# Patient Record
Sex: Male | Born: 1980 | Race: White | Hispanic: No | Marital: Married | Smoking: Current every day smoker
Health system: Southern US, Community
[De-identification: ages and names within clinical notes are randomized; demographics above are authoritative.]

## PROBLEM LIST (undated history)

## (undated) DIAGNOSIS — R569 Unspecified convulsions: Secondary | ICD-10-CM

## (undated) HISTORY — PX: TONSILLECTOMY: SUR1361

---

## 2015-04-07 ENCOUNTER — Encounter: Payer: Self-pay | Admitting: *Deleted

## 2015-04-07 ENCOUNTER — Emergency Department
Admission: EM | Admit: 2015-04-07 | Discharge: 2015-04-07 | Disposition: A | Discharge status: Home / Self Care | Payer: Self-pay | Attending: Emergency Medicine | Admitting: Emergency Medicine

## 2015-04-07 DIAGNOSIS — F17210 Nicotine dependence, cigarettes, uncomplicated: Secondary | ICD-10-CM | POA: Insufficient documentation

## 2015-04-07 DIAGNOSIS — B084 Enteroviral vesicular stomatitis with exanthem: Secondary | ICD-10-CM | POA: Insufficient documentation

## 2015-04-07 NOTE — ED Notes (Signed)
Pt reports having a rash on hands and feet beginning Friday. Rash appears to be little red papules that are scattered around pts finers and palms and onto his toes. Pt reports having a fever Friday and feeling weak and "aching"Pt denies NVD.

## 2015-04-07 NOTE — ED Provider Notes (Signed)
CSN: [id number]     Arrival date & time 04/07/15  1649 History   First MD Initiated Contact with Patient 04/07/15 1731     Chief Complaint  Patient presents with  . Rash     (Consider location/radiation/quality/duration/timing/severity/associated sxs/prior Treatment) HPI  34 year old male presents to emergency department for evaluation of rash on his hands and feet. Symptoms began 3 days ago, he developed a low-grade fever and body aches. Symptoms quickly resolved and over the last 2 days has had a increasing rash on the plantar aspect of his feet as well has palmar aspect of the hands and in between the digits. He denies any oral lesions. No pruritus. No headaches, nausea, vomiting, fevers. No known contacts with hand-foot-and-mouth disease. Discomfort is moderate and mostly on the plantar aspect of his feet with walking. He denies any oral lesions, conjunctivitis.   No past medical history on file. No past surgical history on file. No family history on file. Social History  Substance Use Topics  . Smoking status: Current Every Day Smoker -- 1.00 packs/day    Types: Cigarettes  . Smokeless tobacco: Not on file  . Alcohol Use: No    Review of Systems  Constitutional: Negative.  Negative for fever, chills, activity change and appetite change.  HENT: Negative for congestion, ear pain, mouth sores, rhinorrhea, sinus pressure, sore throat and trouble swallowing.   Eyes: Negative for photophobia, pain and discharge.  Respiratory: Negative for cough, chest tightness and shortness of breath.   Cardiovascular: Negative for chest pain and leg swelling.  Gastrointestinal: Negative for nausea, vomiting, abdominal pain, diarrhea and abdominal distention.  Genitourinary: Negative for dysuria and difficulty urinating.  Musculoskeletal: Negative for back pain, arthralgias and gait problem.  Skin: Positive for rash. Negative for color change.  Neurological: Negative for dizziness and headaches.   Hematological: Negative for adenopathy.  Psychiatric/Behavioral: Negative for behavioral problems and agitation.      Allergies  Review of patient's allergies indicates not on file.  Home Medications   Prior to Admission medications   Not on File   BP 125/62 mmHg  Pulse 51  Temp(Src) 97.9 F (36.6 C) (Oral)  Resp 20  Ht  (1.905 m)  Wt 240 lb (108.863 kg)  BMI 30.00 kg/m2  SpO2 95% Physical Exam  Constitutional: He is oriented to person, place, and time. He appears well-developed and well-nourished.  HENT:  Head: Normocephalic and atraumatic.  Mouth/Throat: Oropharynx is clear and moist. No oropharyngeal exudate.  Eyes: Conjunctivae and EOM are normal. Pupils are equal, round, and reactive to light. Right eye exhibits no discharge. Left eye exhibits no discharge.  Neck: Normal range of motion. Neck supple.  Cardiovascular: Normal rate and intact distal pulses.   Pulmonary/Chest: Effort normal and breath sounds normal. No respiratory distress.  Abdominal: Soft. Bowel sounds are normal. There is no tenderness.  Musculoskeletal: Normal range of motion. He exhibits no edema or tenderness.  Neurological: He is alert and oriented to person, place, and time.  Skin: Skin is warm and dry. Rash noted.  Papular rash on the plantar aspect of both feet with small vesicles. Rash is also interdigital. Patient also has papular rash on the palmar aspect of both hands and in between the digits. Signs of infection. No oral lesions.  Psychiatric: He has a normal mood and affect. His behavior is normal. Judgment and thought content normal.    ED Course  Procedures (including critical care time) Labs Review Labs Reviewed - No data to  display  Imaging Review No results found. I have personally reviewed and evaluated these images and lab results as part of my medical decision-making.   EKG Interpretation None      MDM   Final diagnoses:  Hand, foot and mouth disease    34 year old male with hand-foot-and-mouth disease. He has a papular rash that is nonpruritic on the plantar and palmar aspects as well as interdigital areas of the hands and feet. No other symptoms. Signs stable. Patient was educated on progression of hand-foot-and-mouth disease as well as treatment and prevention of spreading disease. Patient will return to the ER for any worsening symptoms urgent changes in his health.   Evon Slackhomas C Rodel Glaspy, PA-C 04/07/15 1756  Jeanmarie PlantJames A McShane, MD 04/07/15 [phone]

## 2015-04-07 NOTE — Discharge Instructions (Signed)
What is hand, foot, and mouth disease? -- Hand, foot, and mouth disease is an infection that causes sores to form in the mouth, and on the hands, feet, buttocks, and sometimes the genitals. A related infection, called "herpangina," causes sores to form in the mouth. Both infections most often affect children, but adults can get them, too. This article is about hand, foot, and mouth disease, but herpangina and hand, foot, and mouth disease are treated the same. Hand, foot, and mouth disease usually goes away on its own within 2 to 3 days. There are treatments to help with its symptoms. What are the symptoms of hand, foot, and mouth disease? -- The main symptom is sores that form in the mouth, and on the hands, feet, buttocks, and sometimes the genitals. They can look like small red spots, bumps, or blisters (picture 1 and picture 2). The sores in the mouth can make swallowing painful. The sores on the hands and feet might be painful. It is possible to get the sores only in some areas. Not every person gets them on their hands, feet, and mouth. The infection sometimes causes fever. How does hand, foot, and mouth disease spread? -- The virus that causes hand, foot, and mouth disease can travel in body fluids of an infected person. For example, the virus can be found in: ?Mucus from the nose ?Saliva ?Fluid from one of the sores ?Traces of bowel movements People with hand, foot, and mouth disease are most likely to spread the infection during the first week of their illness. But the virus can live in their body for weeks or even months after the symptoms have gone away. Is there a test for hand, foot, and mouth disease? -- Yes, but it is not usually necessary. The doctor or nurse should be able to tell if your child has it by learning about your child's symptoms and doing an exam. Should I call my child's doctor or nurse? -- You should call your child's doctor or nurse if your child is drinking less than usual  and hasn't had a wet diaper for 4 to 6 hours (for babies and young children) or hasn't needed to urinate in the past 6 to 8 hours (for older children). You should also call your child's doctor or nurse if your child seems to be getting worse or isn't getting better after a few days. How is hand, foot, and mouth disease treated? -- The infection itself is not treated. It usually goes away on its own within a few days. But children who are in pain can take nonprescription medicines such as acetaminophen (sample brand name: Tylenol) or ibuprofen (sample brand names: Advil, Motrin) to relieve pain. Never give aspirin to a child younger than 18 years. In children, aspirin can cause a serious problem called Reye syndrome. The sores in the mouth can make swallowing painful, so some children might not want to eat or drink. It is important to make sure that children get enough fluids so that they don't get dehydrated. Cold foods, like popsicles and ice cream, can help to numb the pain. Soft foods, like pudding and gelatin, might be easier to swallow. Can hand, foot, and mouth disease be prevented? -- Yes. The most important thing you can do to prevent the spread of this infection is to wash your hands often with soap and water, even after your child is feeling better. You should teach your children to wash often, especially after using the bathroom. It's also important  to keep your home clean and to disinfect tabletops, toys, and other things that a child might touch. If your child has hand, foot, and mouth disease, keep him or her away from other people during the first week of the illness. All topics are updated as new evidence becomes available and our peer review process is complete.

## 2015-05-12 ENCOUNTER — Encounter: Payer: Self-pay | Admitting: Emergency Medicine

## 2015-05-12 ENCOUNTER — Emergency Department
Admission: EM | Admit: 2015-05-12 | Discharge: 2015-05-12 | Disposition: A | Discharge status: Home / Self Care | Payer: Self-pay | Attending: Emergency Medicine | Admitting: Emergency Medicine

## 2015-05-12 DIAGNOSIS — M791 Myalgia, unspecified site: Secondary | ICD-10-CM

## 2015-05-12 DIAGNOSIS — B9789 Other viral agents as the cause of diseases classified elsewhere: Secondary | ICD-10-CM | POA: Insufficient documentation

## 2015-05-12 DIAGNOSIS — F17210 Nicotine dependence, cigarettes, uncomplicated: Secondary | ICD-10-CM | POA: Insufficient documentation

## 2015-05-12 DIAGNOSIS — J028 Acute pharyngitis due to other specified organisms: Secondary | ICD-10-CM | POA: Insufficient documentation

## 2015-05-12 DIAGNOSIS — R200 Anesthesia of skin: Secondary | ICD-10-CM | POA: Insufficient documentation

## 2015-05-12 DIAGNOSIS — R531 Weakness: Secondary | ICD-10-CM | POA: Insufficient documentation

## 2015-05-12 DIAGNOSIS — J029 Acute pharyngitis, unspecified: Secondary | ICD-10-CM

## 2015-05-12 LAB — POCT RAPID STREP A: STREPTOCOCCUS, GROUP A SCREEN (DIRECT): NEGATIVE

## 2015-05-12 MED ORDER — DIPHENHYDRAMINE HCL 12.5 MG/5ML PO LIQD: 25.0000 mg | Freq: Four times a day (QID) | ORAL | Status: DC | PRN

## 2015-05-12 MED ORDER — DIPHENHYDRAMINE HCL 12.5 MG/5ML PO ELIX
25.0000 mg | ORAL_SOLUTION | Freq: Once | ORAL | Status: AC
  Administered 2015-05-12: 25 mg via ORAL
  Filled 2015-05-12: qty 10

## 2015-05-12 MED ORDER — KETOROLAC TROMETHAMINE 60 MG/2ML IM SOLN
60.0000 mg | Freq: Once | INTRAMUSCULAR | Status: AC
  Administered 2015-05-12: 60 mg via INTRAMUSCULAR
  Filled 2015-05-12: qty 2

## 2015-05-12 MED ORDER — KETOROLAC TROMETHAMINE 10 MG PO TABS: 10.0000 mg | ORAL_TABLET | Freq: Four times a day (QID) | ORAL | Status: DC | PRN

## 2015-05-12 MED ORDER — LIDOCAINE VISCOUS 2 % MT SOLN: 5.0000 mL | Freq: Four times a day (QID) | OROMUCOSAL | Status: DC | PRN

## 2015-05-12 MED ORDER — LIDOCAINE VISCOUS 2 % MT SOLN
15.0000 mL | Freq: Once | OROMUCOSAL | Status: AC
  Administered 2015-05-12: 15 mL via OROMUCOSAL
  Filled 2015-05-12: qty 15

## 2015-05-12 NOTE — ED Notes (Signed)
Pt presents to ED via POV with c/o of fever and generalized body aches. Pt states he has been experiencing presenting sx since Thursday with accompanying "numbness/weakness" to bilateral arms/hands. Pt states he has taken Tylenol and Dayquil without any relief of sx. Pt alert and oriented x4. Pt denies chest pain and SOB.

## 2015-05-12 NOTE — ED Notes (Signed)
Triage note entered under wrong username. Note re-entered to by this RN.

## 2015-05-12 NOTE — ED Provider Notes (Signed)
[facility] Emergency Department Provider Note  ____________________________________________  Time seen: Approximately 7:48 PM  I have reviewed the triage vital signs and the nursing notes.   HISTORY  Chief Complaint Fever and Generalized Body Aches    HPI Kenneth [name] is a 34 y.o. male patient complain of 3 days of body aches and sore throat. Patient states has some transient numbness and weakness to the bilateral upper extremities. Patient denies any neck pain associated with this complaint. Patient denies any upper history illnesses signs symptoms. Patient denies any nausea vomiting diarrhea.Patient has been taking Tylenol and DayQuil without any noticeable relief of his complaint. No other palliative measures taken for this complaint. She rates his pain discomfort as 8/10. Patient described his pain as a soreness of the throat and body aches.   History reviewed. No pertinent past medical history.  There are no active problems to display for this patient.   Past Surgical History  Procedure Laterality Date  . Tonsillectomy      Current Outpatient Rx  Name  Route  Sig  Dispense  Refill  . diphenhydrAMINE (BENADRYL CHILDRENS ALLERGY) 12.5 MG/5ML liquid   Oral   Take 10 mLs (25 mg total) by mouth 4 (four) times daily as needed. Mixed with 5 mL of viscous lidocaine for swish and swallow.   236 mL   0   . ketorolac (TORADOL) 10 MG tablet   Oral   Take 1 tablet (10 mg total) by mouth every 6 (six) hours as needed.   20 tablet   0   . lidocaine (XYLOCAINE) 2 % solution   Mouth/Throat   Use as directed 5 mLs in the mouth or throat every 6 (six) hours as needed for mouth pain. Mixed with 10 mL of Benadryl elixir for swish and swallow.   100 mL   0     Allergies Review of patient's allergies indicates not on file.  No family history on file.  Social History Social History  Substance Use Topics  . Smoking status: Current Every Day Smoker --  1.00 packs/day    Types: Cigarettes  . Smokeless tobacco: None  . Alcohol Use: No    Review of Systems Constitutional: No fever/chills Eyes: No visual changes. ENT: No sore throat. Cardiovascular: Denies chest pain. Respiratory: Denies shortness of breath. Gastrointestinal: No abdominal pain.  No nausea, no vomiting.  No diarrhea.  No constipation. Genitourinary: Negative for dysuria. Musculoskeletal: Negative for back pain. Skin: Negative for rash. Neurological: Negative for headaches, focal weakness or numbness.  10-point ROS otherwise negative.  ____________________________________________   PHYSICAL EXAM:  VITAL SIGNS: ED Triage Vitals  Enc Vitals Group     BP 05/12/15 1927 122/73 mmHg     Pulse Rate 05/12/15 1927 84     Resp 05/12/15 1927 17     Temp 05/12/15 1927 98.6 F (37 C)     Temp Source 05/12/15 1927 Oral     SpO2 05/12/15 1927 97 %     Weight 05/12/15 1927 240 lb (108.863 kg)     Height 05/12/15 1927  (1.905 m)     Head Cir --      Peak Flow --      Pain Score 05/12/15 1929 8     Pain Loc --      Pain Edu? --      Excl. in GC? --     Constitutional: Alert and oriented. Well appearing and in no acute distress. Eyes: Conjunctivae are  normal. PERRL. EOMI. Head: Atraumatic. Nose: No congestion/rhinnorhea. Mouth/Throat: Mucous membranes are moist.  Oropharynx erythematous , without exudate  Neck: No stridor. No cervical spine tenderness to palpation. Hematological/Lymphatic/Immunilogical: No cervical lymphadenopathy. Cardiovascular: Normal rate, regular rhythm. Grossly normal heart sounds.  Good peripheral circulation. Respiratory: Normal respiratory effort.  No retractions. Lungs CTAB. Gastrointestinal: Soft and nontender. No distention. No abdominal bruits. No CVA tenderness. Musculoskeletal: No lower extremity tenderness nor edema.  No joint effusions. Neurologic:  Normal speech and language. No gross focal neurologic deficits are appreciated.  No gait instability. Skin:  Skin is warm, dry and intact. No rash noted. Psychiatric: Mood and affect are normal. Speech and behavior are normal.  ____________________________________________   LABS (all labs ordered are listed, but only abnormal results are displayed)  Labs Reviewed  POCT RAPID STREP A   ____________________________________________  EKG   ____________________________________________  RADIOLOGY   ____________________________________________   PROCEDURES  Procedure(s) performed: None  Critical Care performed: No  ____________________________________________   INITIAL IMPRESSION / ASSESSMENT AND PLAN / ED COURSE  Pertinent labs & imaging results that were available during my care of the patient were reviewed by me and considered in my medical decision making (see chart for details).  Viral pharyngitis. Advised patient cultures pending. Myalgias secondary to viral infection. Patient given home care instructions and advised to take medication as directed. Patient advised follow-up with family clinic if condition persists. ____________________________________________   FINAL CLINICAL IMPRESSION(S) / ED DIAGNOSES  Final diagnoses:  Viral pharyngitis  Myalgia      Joni ReiningRonald K Smith, PA-C 05/12/15 2009  Phineas SemenGraydon Goodman, MD 05/12/15 2023

## 2015-08-05 ENCOUNTER — Inpatient Hospital Stay
Admission: EM | Admit: 2015-08-05 | Discharge: 2015-08-07 | DRG: 558 | Disposition: A | Discharge status: Home / Self Care | Payer: Self-pay | Attending: Internal Medicine | Admitting: Internal Medicine

## 2015-08-05 ENCOUNTER — Encounter: Payer: Self-pay | Admitting: Emergency Medicine

## 2015-08-05 ENCOUNTER — Inpatient Hospital Stay: Payer: Self-pay

## 2015-08-05 DIAGNOSIS — F1610 Hallucinogen abuse, uncomplicated: Secondary | ICD-10-CM | POA: Diagnosis present

## 2015-08-05 DIAGNOSIS — F439 Reaction to severe stress, unspecified: Secondary | ICD-10-CM | POA: Diagnosis present

## 2015-08-05 DIAGNOSIS — F19959 Other psychoactive substance use, unspecified with psychoactive substance-induced psychotic disorder, unspecified: Secondary | ICD-10-CM | POA: Diagnosis present

## 2015-08-05 DIAGNOSIS — R7989 Other specified abnormal findings of blood chemistry: Secondary | ICD-10-CM | POA: Diagnosis present

## 2015-08-05 DIAGNOSIS — E860 Dehydration: Secondary | ICD-10-CM | POA: Diagnosis present

## 2015-08-05 DIAGNOSIS — Z9889 Other specified postprocedural states: Secondary | ICD-10-CM

## 2015-08-05 DIAGNOSIS — N179 Acute kidney failure, unspecified: Secondary | ICD-10-CM | POA: Diagnosis present

## 2015-08-05 DIAGNOSIS — F329 Major depressive disorder, single episode, unspecified: Secondary | ICD-10-CM | POA: Diagnosis present

## 2015-08-05 DIAGNOSIS — F1420 Cocaine dependence, uncomplicated: Secondary | ICD-10-CM | POA: Diagnosis present

## 2015-08-05 DIAGNOSIS — Z888 Allergy status to other drugs, medicaments and biological substances status: Secondary | ICD-10-CM

## 2015-08-05 DIAGNOSIS — F17210 Nicotine dependence, cigarettes, uncomplicated: Secondary | ICD-10-CM | POA: Diagnosis present

## 2015-08-05 DIAGNOSIS — E876 Hypokalemia: Secondary | ICD-10-CM | POA: Diagnosis present

## 2015-08-05 DIAGNOSIS — M25531 Pain in right wrist: Secondary | ICD-10-CM

## 2015-08-05 DIAGNOSIS — D72829 Elevated white blood cell count, unspecified: Secondary | ICD-10-CM | POA: Diagnosis present

## 2015-08-05 DIAGNOSIS — M6282 Rhabdomyolysis: Principal | ICD-10-CM | POA: Diagnosis present

## 2015-08-05 DIAGNOSIS — F19950 Other psychoactive substance use, unspecified with psychoactive substance-induced psychotic disorder with delusions: Secondary | ICD-10-CM

## 2015-08-05 HISTORY — DX: Unspecified convulsions: R56.9

## 2015-08-05 LAB — COMPREHENSIVE METABOLIC PANEL
ALK PHOS: 36 U/L — AB (ref 38–126)
ALT: 73 U/L — AB (ref 17–63)
AST: 135 U/L — AB (ref 15–41)
Albumin: 4.9 g/dL (ref 3.5–5.0)
Anion gap: 13 (ref 5–15)
BILIRUBIN TOTAL: 2 mg/dL — AB (ref 0.3–1.2)
BUN: 34 mg/dL — AB (ref 6–20)
CALCIUM: 9.4 mg/dL (ref 8.9–10.3)
CHLORIDE: 105 mmol/L (ref 101–111)
CO2: 21 mmol/L — ABNORMAL LOW (ref 22–32)
CREATININE: 1.38 mg/dL — AB (ref 0.61–1.24)
Glucose, Bld: 85 mg/dL (ref 65–99)
Potassium: 5.1 mmol/L (ref 3.5–5.1)
Sodium: 139 mmol/L (ref 135–145)
TOTAL PROTEIN: 8.3 g/dL — AB (ref 6.5–8.1)

## 2015-08-05 LAB — URINALYSIS COMPLETE WITH MICROSCOPIC (ARMC ONLY)
BILIRUBIN URINE: NEGATIVE
GLUCOSE, UA: NEGATIVE mg/dL
LEUKOCYTES UA: NEGATIVE
NITRITE: NEGATIVE
PH: 6 (ref 5.0–8.0)
Protein, ur: 100 mg/dL — AB
Specific Gravity, Urine: 1.027 (ref 1.005–1.030)
Squamous Epithelial / LPF: NONE SEEN

## 2015-08-05 LAB — URINE DRUG SCREEN, QUALITATIVE (ARMC ONLY)
AMPHETAMINES, UR SCREEN: NOT DETECTED
Barbiturates, Ur Screen: NOT DETECTED
Benzodiazepine, Ur Scrn: NOT DETECTED
Cannabinoid 50 Ng, Ur ~~LOC~~: POSITIVE — AB
Cocaine Metabolite,Ur ~~LOC~~: NOT DETECTED
MDMA (Ecstasy)Ur Screen: NOT DETECTED
Methadone Scn, Ur: NOT DETECTED
Opiate, Ur Screen: NOT DETECTED
PHENCYCLIDINE (PCP) UR S: NOT DETECTED
TRICYCLIC, UR SCREEN: NOT DETECTED

## 2015-08-05 LAB — CBC WITH DIFFERENTIAL/PLATELET
BASOS ABS: 0 10*3/uL (ref 0–0.1)
Basophils Relative: 0 %
EOS ABS: 0 10*3/uL (ref 0–0.7)
HCT: 46.6 % (ref 40.0–52.0)
Hemoglobin: 16.1 g/dL (ref 13.0–18.0)
Lymphs Abs: 0.7 10*3/uL — ABNORMAL LOW (ref 1.0–3.6)
MCH: 31 pg (ref 26.0–34.0)
MCHC: 34.7 g/dL (ref 32.0–36.0)
MCV: 89.6 fL (ref 80.0–100.0)
MONO ABS: 0.9 10*3/uL (ref 0.2–1.0)
Monocytes Relative: 4 %
Neutro Abs: 21.9 10*3/uL — ABNORMAL HIGH (ref 1.4–6.5)
Neutrophils Relative %: 93 %
PLATELETS: 184 10*3/uL (ref 150–440)
RBC: 5.2 MIL/uL (ref 4.40–5.90)
RDW: 13.4 % (ref 11.5–14.5)
WBC: 23.6 10*3/uL — AB (ref 3.8–10.6)

## 2015-08-05 LAB — CK: CK TOTAL: 8045 U/L — AB (ref 49–397)

## 2015-08-05 LAB — ETHANOL

## 2015-08-05 MED ORDER — SODIUM CHLORIDE 0.9 % IV BOLUS (SEPSIS): 2000.0000 mL | Freq: Once | INTRAVENOUS | Status: DC

## 2015-08-05 MED ORDER — KETOROLAC TROMETHAMINE 30 MG/ML IJ SOLN
30.0000 mg | Freq: Four times a day (QID) | INTRAMUSCULAR | Status: DC | PRN
  Administered 2015-08-05 – 2015-08-06 (×2): 30 mg via INTRAVENOUS
  Filled 2015-08-05 (×2): qty 1

## 2015-08-05 MED ORDER — ALBUTEROL SULFATE (2.5 MG/3ML) 0.083% IN NEBU: 2.5000 mg | INHALATION_SOLUTION | RESPIRATORY_TRACT | Status: DC | PRN

## 2015-08-05 MED ORDER — ALUM & MAG HYDROXIDE-SIMETH 200-200-20 MG/5ML PO SUSP: 30.0000 mL | Freq: Four times a day (QID) | ORAL | Status: DC | PRN

## 2015-08-05 MED ORDER — ONDANSETRON HCL 4 MG PO TABS: 4.0000 mg | ORAL_TABLET | Freq: Four times a day (QID) | ORAL | Status: DC | PRN

## 2015-08-05 MED ORDER — POLYETHYLENE GLYCOL 3350 17 G PO PACK: 17.0000 g | PACK | Freq: Every day | ORAL | Status: DC | PRN

## 2015-08-05 MED ORDER — SODIUM CHLORIDE 0.9 % IV SOLN
1000.0000 mL | Freq: Once | INTRAVENOUS | Status: AC
  Administered 2015-08-05: 1000 mL via INTRAVENOUS

## 2015-08-05 MED ORDER — ONDANSETRON HCL 4 MG/2ML IJ SOLN: 4.0000 mg | Freq: Four times a day (QID) | INTRAMUSCULAR | Status: DC | PRN

## 2015-08-05 MED ORDER — LORAZEPAM 2 MG/ML IJ SOLN
2.0000 mg | Freq: Once | INTRAMUSCULAR | Status: AC
  Administered 2015-08-05: 2 mg via INTRAMUSCULAR

## 2015-08-05 MED ORDER — LORAZEPAM 2 MG PO TABS
2.0000 mg | ORAL_TABLET | Freq: Four times a day (QID) | ORAL | Status: DC | PRN
  Administered 2015-08-05 – 2015-08-06 (×3): 2 mg via ORAL
  Filled 2015-08-05 (×2): qty 1

## 2015-08-05 MED ORDER — ENOXAPARIN SODIUM 40 MG/0.4ML ~~LOC~~ SOLN
40.0000 mg | SUBCUTANEOUS | Status: DC
Start: 2015-08-05 — End: 2015-08-07
  Administered 2015-08-05 – 2015-08-06 (×2): 40 mg via SUBCUTANEOUS
  Filled 2015-08-05 (×3): qty 0.4

## 2015-08-05 MED ORDER — SODIUM CHLORIDE 0.9% FLUSH: 3.0000 mL | Freq: Two times a day (BID) | INTRAVENOUS | Status: DC

## 2015-08-05 MED ORDER — LORAZEPAM 2 MG PO TABS
ORAL_TABLET | ORAL | Status: AC
  Administered 2015-08-05: 2 mg via ORAL
  Filled 2015-08-05: qty 1

## 2015-08-05 MED ORDER — SODIUM CHLORIDE 0.9 % IV SOLN
INTRAVENOUS | Status: DC
  Administered 2015-08-05 – 2015-08-07 (×6): via INTRAVENOUS

## 2015-08-05 MED ORDER — SODIUM CHLORIDE 0.9 % IV BOLUS (SEPSIS)
1000.0000 mL | Freq: Once | INTRAVENOUS | Status: AC
  Administered 2015-08-05: 1000 mL via INTRAVENOUS

## 2015-08-05 MED ORDER — NICOTINE 14 MG/24HR TD PT24
14.0000 mg | MEDICATED_PATCH | Freq: Every day | TRANSDERMAL | Status: DC
  Administered 2015-08-05 – 2015-08-07 (×3): 14 mg via TRANSDERMAL
  Filled 2015-08-05 (×3): qty 1

## 2015-08-05 MED ORDER — HALOPERIDOL LACTATE 5 MG/ML IJ SOLN
5.0000 mg | Freq: Once | INTRAMUSCULAR | Status: AC
  Administered 2015-08-05: 5 mg via INTRAMUSCULAR

## 2015-08-05 MED ORDER — KETOROLAC TROMETHAMINE 10 MG PO TABS: 10.0000 mg | ORAL_TABLET | Freq: Four times a day (QID) | ORAL | Status: DC | PRN

## 2015-08-05 NOTE — ED Notes (Signed)
Patient escalating, pacing, seeing people in hall who are not there. Med po. Will return to room with encouragement. On phone with family.

## 2015-08-05 NOTE — ED Notes (Signed)
Attempt IV R hand unsuccessful however able to draw blood from site.

## 2015-08-05 NOTE — ED Notes (Signed)
BEHAVIORAL HEALTH ROUNDING Patient sleeping: Yes.   Patient alert and oriented: not applicable Behavior appropriate: Yes.  ; If no, describe:  Nutrition and fluids offered: Yes  Toileting and hygiene offered: Yes  Sitter present: not applicable Law enforcement present: Yes  
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
## 2015-08-05 NOTE — ED Notes (Signed)
BEHAVIORAL HEALTH ROUNDING Patient sleeping: Yes.   Patient alert and oriented: not applicable Behavior appropriate: Yes.  ; If no, describe:  Nutrition and fluids offered: No Toileting and hygiene offered: No Sitter present: not applicable Law enforcement present: Yes  

## 2015-08-05 NOTE — ED Notes (Signed)
Patient calming, states feeling more in control.

## 2015-08-05 NOTE — ED Provider Notes (Signed)
[facility] Emergency Department Provider Note  ____________________________________________    I have reviewed the triage vital signs and the nursing notes.   HISTORY  Chief Complaint Altered Mental Status    HPI Kenneth Kenneth [name] is a 35 y.o. male who presents with altered mental status. He is accompanied by police and is handcuffed. Reportedly police were called to his neighborhood because he was acting bizarre and knocking on people's doors in the middle of the night. Patient reports he was just high but his fairly paranoid and worried that we are going to "shoot him ". He admits to crack cocaine and police also report that he used "molly"     Past Medical History  Diagnosis Date  . Seizures (HCC)     There are no active problems to display for this patient.   Past Surgical History  Procedure Laterality Date  . Tonsillectomy      Current Outpatient Rx  Name  Route  Sig  Dispense  Refill  . diphenhydrAMINE (BENADRYL CHILDRENS ALLERGY) 12.5 MG/5ML liquid   Oral   Take 10 mLs (25 mg total) by mouth 4 (four) times daily as needed. Mixed with 5 mL of viscous lidocaine for swish and swallow.   236 mL   0   . ketorolac (TORADOL) 10 MG tablet   Oral   Take 1 tablet (10 mg total) by mouth Kenneth [name] 6 (six) hours as needed.   20 tablet   0   . lidocaine (XYLOCAINE) 2 % solution   Mouth/Throat   Use as directed 5 mLs in the mouth or throat Kenneth [name] 6 (six) hours as needed for mouth pain. Mixed with 10 mL of Benadryl elixir for swish and swallow.   100 mL   0     Allergies Ibuprofen and Tylenol  No family history on file.  Social History Social History  Substance Use Topics  . Smoking status: Current Kenneth [name] Day Smoker -- 1.00 packs/day    Types: Cigarettes  . Smokeless tobacco: None  . Alcohol Use: No    Level V caveat: Review of Systems Limited due to altered mental status and lack of cooperation     Cardiovascular: Denies chest  pain Respiratory: Denies shortness of breath.   Psychiatric: Anxious and paranoid    ____________________________________________   PHYSICAL EXAM:  VITAL SIGNS: ED Triage Vitals  Enc Vitals Group     BP 08/05/15 0730 148/125 mmHg     Pulse Rate 08/05/15 0730 106     Resp 08/05/15 0730 20     Temp 08/05/15 0730 97.9 F (36.6 C)     Temp Source 08/05/15 0730 Axillary     SpO2 08/05/15 0730 99 %     Weight 08/05/15 0730 200 lb (90.719 kg)     Height 08/05/15 0730 6' (1.829 m)     Head Cir --      Peak Flow --      Pain Score --      Pain Loc --      Pain Edu? --      Excl. in GC? --      Constitutional: Agitated, uncooperative, violent, hands handcuffed on arrival Eyes: Conjunctivae are normal. No erythema or injection ENT   Head: Normocephalic and atraumatic.   Mouth/Throat: Mucous membranes are moist. Cardiovascular: Tachycardia, regular rhythm. Normal and symmetric distal pulses are present in the upper extremities.  Respiratory: Normal respiratory effort without tachypnea nor retractions. Breath sounds are clear and equal bilaterally.  Gastrointestinal:  Soft and non-tender in all quadrants. No distention. There is no CVA tenderness. Genitourinary: deferred Musculoskeletal: Nontender with normal range of motion in all extremities. No lower extremity tenderness nor edema. Neurologic:  Normal speech and language. No gross focal neurologic deficits are appreciated. Skin:  Skin is warm, dry and intact. No rash noted. Psychiatric: Patient is paranoid and highly anxious and agitated  ____________________________________________    LABS (pertinent positives/negatives)  Labs Reviewed  COMPREHENSIVE METABOLIC PANEL  ETHANOL  CBC WITH DIFFERENTIAL/PLATELET  URINE DRUG SCREEN, QUALITATIVE ([facility] ONLY)    ____________________________________________   EKG  ED ECG REPORT I, Kenneth Kenneth [name], the attending physician, personally viewed and interpreted this  ECG.  Date: 08/05/2015 EKG Time: 7:57 AM Rate: 101 Rhythm: Sinus tachycardia QRS Axis: normal Intervals: normal ST/T Wave abnormalities: normal Conduction Disturbances: none Narrative Interpretation: Nonspecific   ____________________________________________    RADIOLOGY  none  ____________________________________________   PROCEDURES  Procedure(s) performed: none  Critical Care performed: none  ____________________________________________   INITIAL IMPRESSION / ASSESSMENT AND PLAN / ED COURSE  Pertinent labs & imaging results that were available during my care of the patient were reviewed by me and considered in my medical decision making (see chart for details).  Patient presents with altered mental status and agitation, apparently after using cocaine and "Molly ". He is a danger to the staff and his current state and to himself. 2 mg of IM Ativan given and 5 of Haldol IM to calm the patient  Patient with tea colored urine. CK sent.  +hgb dipstick on urine but no rbcs, CK 8500 c/w rhabdomyolysis. NS started  ____________________________________________   FINAL CLINICAL IMPRESSION(S) / ED DIAGNOSES  Final diagnoses:  Non-traumatic rhabdomyolysis          Kenneth Every, MD 08/05/15 1401

## 2015-08-05 NOTE — ED Notes (Signed)
Unable to change into scrubs at this point. Pt delusional re people trying to hurt him. In sweats and t shirt. Will change when able.

## 2015-08-05 NOTE — BH Assessment (Signed)
Assessment Note  Kenneth [name] is an 35 y.o. male who presents to the ER due to odd and bizarre behaviors. Per report of the patient, he had taking two "mollies (Ecstasy/Psychedelic Drug)" his wife had giving him. This was the first time he has tried any Psychedelic drugs. He states, "this messed me up. I won't do this again. No never.." He admits to having a history of cocaine and Cannabis use. Last time of use of cocaine was approximately a month ago and uses one to two times a month. His cannabis use is on a weekly basis.  During the initially part of the interview, the patient was lucid and able to respond to the questions appropriately. However, he was giving Ativan prior to this writer starting the interview; therefore towards the end of the interview he became drowsy and lethargic. He was trying to answer the questions but was too drowsy to participate. He was able to give this Clinical research associate the contact information for his wife.  According to his wife [phone]), she was with him on last night (08/04/2015). "I was stuck with him in the house. He was acting crazy and everything. He was talking out of his head. Yelling, screaming and getting upset because he thought people were trying to kill him. He kept saying he saw a red dot on him and people are trying to kill him." Wife further explains, she eventually was able to leave the home and it was approximately 4am. "I was walking down the street and he was walking behind me. He was yelling and screaming for help. I kept walking. I knew he was behind me but then he ran off somewhere.." Wife further explains, she called her family to come pick her up. Their children was with her mother, out of town. She stayed with her mother and rode with her to take the children to school. When she returned home from taking her children to school, she noticed he had not come been back to the house.  Writer asked whether or not she felt the patient was a danger to himself  of others, she replied by saying "yea." "He was yelling and screaming for help. One time he had a knife in his hand, waving it in the air. I think he really believed some body was trying to kill him.Marland KitchenMarland KitchenWhen I noticed he wasn't behind me, I kept walking..."   Writer then asked why she didn't call 911?, She stated "I had my cell phone in my right coat pocket. I only had 9% charge on my battery. I know this sounds selfish but I used it to call my family to come pick me up.You got to understand, I was with him for 24 hours trying to calm him down."   She reports of having knowledge his past Cocaine use. "I never seen him like this before."  With this Clinical research associate, patient denied SI/HI and AV/H. He also denied AV/H. However, ER staff states he was paranoid and delusional. He was also reporting of seeing things that wasn't there. When he was told, they weren't there he became upset and agitated.   Diagnosis: Substance Induce Psychosis.  Past Medical History:  Past Medical History  Diagnosis Date  . Seizures [facility])     Past Surgical History  Procedure Laterality Date  . Tonsillectomy      Family History: No family history on file.  Social History:  reports that he has been smoking Cigarettes.  He has been smoking about 1.00 pack per  day. He does not have any smokeless tobacco history on file. He reports that he does not drink alcohol or use illicit drugs.  Additional Social History:  Alcohol / Drug Use Pain Medications: See PTA Prescriptions: See PTA Over the Counter: See PTA History of alcohol / drug use?: Yes Longest period of sobriety (when/how long): "Maybe like two years." Negative Consequences of Use: Work / School Withdrawal Symptoms:  (None Reported) Substance #1 Name of Substance 1: Cannabis 1 - Age of First Use: 19 1 - Amount (size/oz): "One or two totes" 1 - Frequency: One to two times a week 1 - Duration: Approximately 3 years 1 - Last Use / Amount: 06/2015 Substance #2 Name of  Substance 2: Cocaine 2 - Age of First Use: 21 2 - Amount (size/oz): $10 2 - Frequency: "Maybe once a month 2 - Duration: 10 years 2 - Last Use / Amount: 06/2015 Substance #3 Name of Substance 3: "Molly" (Ecstasy/Psychedelic Drug) 3 - Age of First Use: 34 3 - Amount (size/oz): "2 pills: 3 - Frequency: One time use 3 - Duration: One time use 3 - Last Use / Amount: 08/05/2015  CIWA: CIWA-Ar BP: 132/81 mmHg Pulse Rate: 100 COWS:    Allergies:  Allergies  Allergen Reactions  . Ibuprofen   . Tylenol [Acetaminophen]     Home Medications:  (Not in a hospital admission)  OB/GYN Status:  No LMP for male patient.  General Assessment Data Location of Assessment: [facility] ED TTS Assessment: In system Is this a Tele or Face-to-Face Assessment?: Face-to-Face Is this an Initial Assessment or a Re-assessment for this encounter?: Initial Assessment Marital status: Married [place] name: n/a Is patient pregnant?: No Pregnancy Status: No Living Arrangements: Spouse/significant other, Children Can pt return to current living arrangement?: Yes Admission Status: Voluntary Is patient capable of signing voluntary admission?: Yes Referral Source: Self/Family/Friend Insurance type: None  Medical Screening Exam [facility] Walk-in ONLY) Medical Exam completed: Yes  Crisis Care Plan Living Arrangements: Spouse/significant other, Children Legal Guardian: Other: Name of Psychiatrist: None Reported Name of Therapist: None Reported  Education Status Is patient currently in school?: No Current Grade: n/a Highest grade of school patient has completed: 11th Grade Name of school: n/a Contact person: n/a  Risk to self with the past 6 months Suicidal Ideation: No Has patient been a risk to self within the past 6 months prior to admission? : No Suicidal Intent: No Has patient had any suicidal intent within the past 6 months prior to admission? : No Is patient at risk for suicide?: No Suicidal Plan?:  No Has patient had any suicidal plan within the past 6 months prior to admission? : No Access to Means: No What has been your use of drugs/alcohol within the last 12 months?: Cannabis, Cocaine & One time use of "Molly Radiographer, therapeutic Drug) Previous Attempts/Gestures: No Other Self Harm Risks: None Reported Triggers for Past Attempts: None known Intentional Self Injurious Behavior: None Family Suicide History: Unknown Recent stressful life event(s): Other (Comment) (None Reported) Persecutory voices/beliefs?: No Depression: No Depression Symptoms:  (Reports of none) Substance abuse history and/or treatment for substance abuse?: Yes Suicide prevention information given to non-admitted patients: Yes  Risk to Others within the past 6 months Homicidal Ideation: No Does patient have any lifetime risk of violence toward others beyond the six months prior to admission? : No Thoughts of Harm to Others: No Current Homicidal Intent: No Current Homicidal Plan: No Access to Homicidal Means: No Identified Victim: None Reported History of  harm to others?: No Assessment of Violence: On admission Violent Behavior Description: None Reported Does patient have access to weapons?: No Criminal Charges Pending?: No Does patient have a court date: No Is patient on probation?: No  Psychosis Hallucinations: Auditory, Olfactory, Tactile (While in the ER) Delusions: Persecutory, Grandiose (While in ER)  Mental Status Report Appearance/Hygiene: In scrubs, In hospital gown, Unable to Assess Eye Contact: Poor Motor Activity: Unsteady Speech: Logical/coherent, Soft, Slurred Level of Consciousness: Drowsy Mood: Labile, Suspicious, Pleasant Affect: Labile Anxiety Level: Moderate Thought Processes: Coherent, Relevant Judgement: Impaired Orientation: Person, Place, Time, Situation, Appropriate for developmental age (Prior to giving medications) Obsessive Compulsive Thoughts/Behaviors:  Minimal  Cognitive Functioning Concentration: Decreased Memory: Recent Impaired, Remote Intact IQ: Average Insight: Fair Impulse Control: Poor Appetite: Good Weight Loss: 0 Weight Gain: 0 Sleep: No Change Total Hours of Sleep: 8 Vegetative Symptoms: None  ADLScreening [facility] Assessment Services) Patient's cognitive ability adequate to safely complete daily activities?: Yes Patient able to express need for assistance with ADLs?: Yes Independently performs ADLs?: Yes (appropriate for developmental age)  Prior Inpatient Therapy Prior Inpatient Therapy: Yes Prior Therapy Dates: 2014 [facility] Reason for Treatment: Substance Abuse  Prior Outpatient Therapy Prior Outpatient Therapy: No Prior Therapy Dates: Reports of none Prior Therapy Facilty/Provider(s): Reports of none Reason for Treatment: Reports of none Does patient have an ACCT team?: No Does patient have Intensive In-House Services?  : No Does patient have [facility] services? : No Does patient have P4CC services?: No  ADL Screening (condition at time of admission) Patient's cognitive ability adequate to safely complete daily activities?: Yes Is the patient deaf or have difficulty hearing?: No Does the patient have difficulty seeing, even when wearing glasses/contacts?: No Does the patient have difficulty concentrating, remembering, or making decisions?: No Patient able to express need for assistance with ADLs?: Yes Does the patient have difficulty dressing or bathing?: No Independently performs ADLs?: Yes (appropriate for developmental age) Does the patient have difficulty walking or climbing stairs?: No Weakness of Legs: None Weakness of Arms/Hands: None     Therapy Consults (therapy consults require a physician order) PT Evaluation Needed: No OT Evalulation Needed: No SLP Evaluation Needed: No Abuse/Neglect Assessment (Assessment to be complete while patient is  alone) Physical Abuse: Denies Verbal Abuse: Yes, present (Comment) (By wiife "We both did it to each other") Sexual Abuse: Denies Exploitation of patient/patient's resources: Denies Self-Neglect: Denies Values / Beliefs Cultural Requests During Hospitalization: None Spiritual Requests During Hospitalization: None Consults Spiritual Care Consult Needed: No Social Work Consult Needed: No Merchant navy officerAdvance Directives (For Healthcare) Does patient have an advance directive?: No Would patient like information on creating an advanced directive?: No - patient declined information    Additional Information 1:1 In Past 12 Months?: No CIRT Risk: No Elopement Risk: No Does patient have medical clearance?: No  Child/Adolescent Assessment Running Away Risk: Denies (Patient is an adult)  Disposition:  Disposition Initial Assessment Completed for this Encounter: Yes Disposition of Patient: Other dispositions (Patient is being admitted to medical floor)  On Site Evaluation by:   Reviewed with Physician:    Lilyan Gilfordalvin J. Lylee Corrow MS, LCAS, LPC, NCC, CCSI Therapeutic Triage Specialist 08/05/2015 2:31 PM

## 2015-08-05 NOTE — ED Notes (Signed)
BEHAVIORAL HEALTH ROUNDING Patient sleeping: No. Patient alert and oriented: no Behavior appropriate: No.; If no, describe:  Nutrition and fluids offered: Yes  Toileting and hygiene offered: Yes  Sitter present: not applicable Law enforcement present: Yes  

## 2015-08-05 NOTE — ED Notes (Signed)
Patient escalating, thinks people are trying to kill him. Thinks his family is in the room trying to kill him. Tring to get off bed. Continues in police handcuff with officer at bedside.  Med per order.

## 2015-08-05 NOTE — ED Notes (Signed)
ENVIRONMENTAL ASSESSMENT Potentially harmful objects out of patient reach: Yes.   Personal belongings secured: Yes.   Patient dressed in hospital provided attire only: No. Plastic bags out of patient reach: Yes.   Patient care equipment (cords, cables, call bells, lines, and drains) shortened, removed, or accounted for: Yes.   Equipment and supplies removed from bottom of stretcher: Yes.   Potentially toxic materials out of patient reach: Yes.   Sharps container removed or out of patient reach: Yes.   

## 2015-08-05 NOTE — ED Notes (Signed)
Patient arrives handcuffed with sheriff yelling "Help me, Help me" repetitively. States has been using crack. Speech clear, MAE well. MD to bedside. [facility] running around bizarre behavior and combative prior to arrival. Patient able to stay on bed. Med IM for calming. Pt agreeable to plan of care.

## 2015-08-05 NOTE — ED Notes (Signed)
Patient drowsy. Changed into scrubs without resistance. Returns to resting with eyes closed, resp unlabored, skin warm and dry and pink.

## 2015-08-05 NOTE — ED Notes (Signed)
No sitter available on floor at this time. Nurse will call for report when she has sitter in place.

## 2015-08-05 NOTE — ED Notes (Signed)
Sleeping, resp unlabored, turns self in bed.

## 2015-08-05 NOTE — ED Notes (Signed)
Calming, interview with TTS, seen by admitting MD.

## 2015-08-05 NOTE — H&P (Signed)
[facility] [place] at [facility]   PATIENT NAME: Kenneth [name]    MR#:  [id number]  DATE OF BIRTH:  16-Jul-1980  DATE OF ADMISSION:  08/05/2015  PRIMARY CARE PHYSICIAN: No PCP Per Patient   REQUESTING/REFERRING PHYSICIAN: Dr. Cyril Loosen  CHIEF COMPLAINT:   Chief Complaint  Patient presents with  . Altered Mental Status    HISTORY OF PRESENT ILLNESS:  Kenneth [name] is a 35 y.o. male who presents with altered mental status. He is accompanied by police and is handcuffed. Reportedly police were called to his neighborhood because he was acting bizarre and knocking on people's doors in the middle of the night. Patient reports he was just high but his fairly paranoid and worried that we are going to "shoot him ". He admits to crack cocaine and police also report that he used "molly" Dark urine in ED. CK checked and >8000. Mild elevation of creatinine.  IVC placed in ED. Psych consulted.  History obtained from old records and ED staff. Patient is drowzy and poor historian  He does complain of pleuritic left lower chest pain  PAST MEDICAL HISTORY:   Past Medical History  Diagnosis Date  . Seizures (HCC)     PAST SURGICAL HISTORY:   Past Surgical History  Procedure Laterality Date  . Tonsillectomy      SOCIAL HISTORY:   Social History  Substance Use Topics  . Smoking status: Current Every Day Smoker -- 1.00 packs/day    Types: Cigarettes  . Smokeless tobacco: Not on file  . Alcohol Use: No    FAMILY HISTORY:   Patient unable to give hsitory  DRUG ALLERGIES:   Allergies  Allergen Reactions  . Ibuprofen   . Tylenol [Acetaminophen]     REVIEW OF SYSTEMS:   Review of Systems  Constitutional: Positive for malaise/fatigue. Negative for fever, chills and weight loss.  HENT: Negative for hearing loss and nosebleeds.   Eyes: Negative for blurred vision, double vision and pain.  Respiratory: Negative for cough, hemoptysis, sputum production,  shortness of breath and wheezing.   Cardiovascular: Positive for chest pain. Negative for palpitations, orthopnea and leg swelling.  Gastrointestinal: Positive for nausea. Negative for vomiting, abdominal pain, diarrhea and constipation.  Genitourinary: Negative for dysuria and hematuria.  Musculoskeletal: Negative for myalgias, back pain and falls.  Skin: Negative for rash.  Neurological: Positive for dizziness. Negative for tremors, sensory change, speech change, focal weakness, seizures and headaches.  Endo/Heme/Allergies: Does not bruise/bleed easily.  Psychiatric/Behavioral: Positive for hallucinations. Negative for depression and memory loss. The patient has insomnia. The patient is not nervous/anxious.     MEDICATIONS AT HOME:   Prior to Admission medications   Medication Sig Start Date End Date Taking? Authorizing Provider  diphenhydrAMINE (BENADRYL CHILDRENS ALLERGY) 12.5 MG/5ML liquid Take 10 mLs (25 mg total) by mouth 4 (four) times daily as needed. Mixed with 5 mL of viscous lidocaine for swish and swallow. 05/12/15   Joni Reining, PA-C  ketorolac (TORADOL) 10 MG tablet Take 1 tablet (10 mg total) by mouth every 6 (six) hours as needed. 05/12/15   Joni Reining, PA-C  lidocaine (XYLOCAINE) 2 % solution Use as directed 5 mLs in the mouth or throat every 6 (six) hours as needed for mouth pain. Mixed with 10 mL of Benadryl elixir for swish and swallow. 05/12/15   Joni Reining, PA-C     VITAL SIGNS:  Blood pressure 132/81, pulse 100, temperature 97.9 F (36.6 C), temperature source  Axillary, resp. rate 20, height 6' (1.829 m), weight 90.719 kg (200 lb), SpO2 99 %.  PHYSICAL EXAMINATION:  Physical Exam  GENERAL:  35 y.o.-year-old patient lying in the bed , anxious EYES: Pupils equal, round, reactive to light and accommodation. No scleral icterus. Extraocular muscles intact.  HEENT: Head atraumatic, normocephalic. Oropharynx and nasopharynx clear. No oropharyngeal erythema,  moist oral dry NECK:  Supple, no jugular venous distention. No thyroid enlargement, no tenderness.  LUNGS: Normal breath sounds bilaterally, no wheezing, rales, rhonchi. No use of accessory muscles of respiration. Left chest wall tenderness CARDIOVASCULAR: S1, S2 normal. No murmurs, rubs, or gallops.  ABDOMEN: Soft, nontender, nondistended. Bowel sounds present. No organomegaly or mass.  EXTREMITIES: No pedal edema, cyanosis, or clubbing. + 2 pedal & radial pulses b/l.   NEUROLOGIC: Cranial nerves II through XII are intact. No focal Motor or sensory deficits appreciated b/l PSYCHIATRIC: The patient is alert and awake. Anxious SKIN: No obvious rash, lesion, or ulcer.   LABORATORY PANEL:   CBC  Recent Labs Lab 08/05/15 0845  WBC 23.6*  HGB 16.1  HCT 46.6  PLT 184   ------------------------------------------------------------------------------------------------------------------  Chemistries   Recent Labs Lab 08/05/15 0845  NA 139  K 5.1  CL 105  CO2 21*  GLUCOSE 85  BUN 34*  CREATININE 1.38*  CALCIUM 9.4  AST 135*  ALT 73*  ALKPHOS 36*  BILITOT 2.0*   ------------------------------------------------------------------------------------------------------------------  Cardiac Enzymes No results for input(s): TROPONINI in the last 168 hours. ------------------------------------------------------------------------------------------------------------------  RADIOLOGY:  No results found.   IMPRESSION AND PLAN:   * Acute rhabdomyolysis Due to drug use. Dehydration Bolus IVF and start NS at 13850ml/hr. Mild elevation of creatinine. Repeat CK and Cr in AM  * leucocytosis due to stress reaction and hemoconcentration likely Repeat labs after hydration Monitor for fever Check Blood cx.  * Cocaine and MDMA abuse Consulted psychiatry IVC in place Sitter at bedside  * Left chest tenderness Check Xray for rib fracture  * DVT prophylaxis with Lovenox  All the  records are reviewed and case discussed with ED provider. Management plans discussed with the patient, family and they are in agreement.  CODE STATUS: FULL  TOTAL TIME TAKING CARE OF THIS PATIENT: 40 minutes.   Milagros LollSudini, [name] R M.D on 08/05/2015 at 2:12 PM  Between 7am to 6pm - Pager - [phone]  After 6pm go to [email or web address] - password [email or web address] [email or web address]  [place] [facility] [place]  Office  [phone]  CC: Primary care physician; No PCP Per Patient  Note: This dictation was prepared with Dragon dictation along with smaller phrase technology. Any transcriptional errors that result from this process are unintentional.

## 2015-08-05 NOTE — ED Notes (Signed)
Void tea colored urine. Spec to lab and MD notified.

## 2015-08-05 NOTE — ED Notes (Signed)
Pt sleeping. 

## 2015-08-06 DIAGNOSIS — F1420 Cocaine dependence, uncomplicated: Secondary | ICD-10-CM

## 2015-08-06 DIAGNOSIS — F19950 Other psychoactive substance use, unspecified with psychoactive substance-induced psychotic disorder with delusions: Secondary | ICD-10-CM

## 2015-08-06 DIAGNOSIS — F1610 Hallucinogen abuse, uncomplicated: Secondary | ICD-10-CM

## 2015-08-06 LAB — CK: CK TOTAL: 27483 U/L — AB (ref 49–397)

## 2015-08-06 LAB — COMPREHENSIVE METABOLIC PANEL
ALK PHOS: 26 U/L — AB (ref 38–126)
ALT: 78 U/L — AB (ref 17–63)
AST: 222 U/L — AB (ref 15–41)
Albumin: 3.9 g/dL (ref 3.5–5.0)
Anion gap: 5 (ref 5–15)
BILIRUBIN TOTAL: 1.7 mg/dL — AB (ref 0.3–1.2)
BUN: 26 mg/dL — AB (ref 6–20)
CALCIUM: 8.2 mg/dL — AB (ref 8.9–10.3)
CO2: 21 mmol/L — ABNORMAL LOW (ref 22–32)
CREATININE: 1 mg/dL (ref 0.61–1.24)
Chloride: 110 mmol/L (ref 101–111)
Glucose, Bld: 103 mg/dL — ABNORMAL HIGH (ref 65–99)
Potassium: 3.4 mmol/L — ABNORMAL LOW (ref 3.5–5.1)
Sodium: 136 mmol/L (ref 135–145)
TOTAL PROTEIN: 6.6 g/dL (ref 6.5–8.1)

## 2015-08-06 LAB — CBC WITH DIFFERENTIAL/PLATELET
BASOS PCT: 0 %
Basophils Absolute: 0 10*3/uL (ref 0–0.1)
EOS ABS: 0.2 10*3/uL (ref 0–0.7)
Eosinophils Relative: 2 %
HCT: 39.3 % — ABNORMAL LOW (ref 40.0–52.0)
Hemoglobin: 13.8 g/dL (ref 13.0–18.0)
Lymphocytes Relative: 21 %
Lymphs Abs: 2.1 10*3/uL (ref 1.0–3.6)
MCH: 31.9 pg (ref 26.0–34.0)
MCHC: 35 g/dL (ref 32.0–36.0)
MCV: 91.1 fL (ref 80.0–100.0)
MONO ABS: 0.9 10*3/uL (ref 0.2–1.0)
MONOS PCT: 10 %
NEUTROS PCT: 67 %
Neutro Abs: 6.5 10*3/uL (ref 1.4–6.5)
PLATELETS: 147 10*3/uL — AB (ref 150–440)
RBC: 4.31 MIL/uL — ABNORMAL LOW (ref 4.40–5.90)
RDW: 13.1 % (ref 11.5–14.5)
WBC: 9.7 10*3/uL (ref 3.8–10.6)

## 2015-08-06 LAB — MAGNESIUM: MAGNESIUM: 2.3 mg/dL (ref 1.7–2.4)

## 2015-08-06 MED ORDER — KETOROLAC TROMETHAMINE 30 MG/ML IJ SOLN: 30.0000 mg | Freq: Four times a day (QID) | INTRAMUSCULAR | Status: DC | PRN

## 2015-08-06 MED ORDER — OXYCODONE HCL 5 MG PO TABS
5.0000 mg | ORAL_TABLET | Freq: Four times a day (QID) | ORAL | Status: DC | PRN
  Administered 2015-08-06 – 2015-08-07 (×4): 5 mg via ORAL
  Filled 2015-08-06 (×4): qty 1

## 2015-08-06 MED ORDER — POTASSIUM CHLORIDE CRYS ER 20 MEQ PO TBCR
40.0000 meq | EXTENDED_RELEASE_TABLET | Freq: Once | ORAL | Status: AC
  Administered 2015-08-06: 40 meq via ORAL
  Filled 2015-08-06: qty 2

## 2015-08-06 MED ORDER — LORAZEPAM 2 MG PO TABS: 2.0000 mg | ORAL_TABLET | ORAL | Status: DC | PRN

## 2015-08-06 MED ORDER — OXYCODONE-ACETAMINOPHEN 5-325 MG PO TABS: 1.0000 | ORAL_TABLET | Freq: Four times a day (QID) | ORAL | Status: DC | PRN

## 2015-08-06 MED ORDER — PANTOPRAZOLE SODIUM 40 MG PO TBEC
40.0000 mg | DELAYED_RELEASE_TABLET | ORAL | Status: DC
Start: 2015-08-06 — End: 2015-08-07
  Administered 2015-08-06: 40 mg via ORAL
  Filled 2015-08-06: qty 1

## 2015-08-06 NOTE — Care Management Note (Addendum)
Case Management Note  Patient Details  Name: Kenneth [name] MRN: [id number] Date of BirtDorene Grebeh: 22-Nov-1980  Subjective/Objective:     34yo Mr Dorene GrebeJason Cato was admitted 08/05/15 with Rhabdomyolysis, altered mental status and substance abuse. Has a 1:1 sitter per threats to elope and is on IVC. A Psych Consult is pending. Labile mood. [name] was crying earlier today after talking with Dr Imogene Burnhen. Case management will follow for discharge planning.                Action/Plan:   Expected Discharge Date:                  Expected Discharge Plan:     In-House Referral:     Discharge planning Services     Post Acute Care Choice:    Choice offered to:     DME Arranged:    DME Agency:     HH Arranged:    HH Agency:     Status of Service:     Medicare Important Message Given:  Yes Date Medicare IM Given:    Medicare IM give by:    Date Additional Medicare IM Given:    Additional Medicare Important Message give by:     If discussed at Long Length of Stay Meetings, dates discussed:    Additional Comments:  Riki Gehring A, RN 08/06/2015, 2:28 PM
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
## 2015-08-06 NOTE — Care Management Important Message (Signed)
Important Message  Patient Details  Name: Kenneth GrebeJason [name] MRN: [id number] Date of Birth: 29-Oct-1980   Medicare Important Message Given:  Yes    Taylinn Brabant A, RN 08/06/2015, 9:14 AM
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
## 2015-08-06 NOTE — Progress Notes (Signed)
Pt getting agitated saying "he is going to leave", PRN Ativan given. Pt also requesting pain medicine, does not want PRN Toradol. MD Imogene Burnhen notified of the above. Orders received for Oxycodone 5 mg Q6 PRN.

## 2015-08-06 NOTE — Progress Notes (Signed)
Clinical Child psychotherapistocial Worker (CSW) received consult for "Behavioral health issues." Psych consult is pending. CSW will wait to see patient after psych evaluation and recommendations.   Jetta LoutBailey Morgan, LCSW [phone]

## 2015-08-06 NOTE — Progress Notes (Signed)
Patient requested Bible. Pastoral Care and scripture provided.

## 2015-08-07 LAB — BASIC METABOLIC PANEL
ANION GAP: 3 — AB (ref 5–15)
BUN: 17 mg/dL (ref 6–20)
CHLORIDE: 112 mmol/L — AB (ref 101–111)
CO2: 22 mmol/L (ref 22–32)
Calcium: 8.1 mg/dL — ABNORMAL LOW (ref 8.9–10.3)
Creatinine, Ser: 0.79 mg/dL (ref 0.61–1.24)
Glucose, Bld: 112 mg/dL — ABNORMAL HIGH (ref 65–99)
Potassium: 3.9 mmol/L (ref 3.5–5.1)
SODIUM: 137 mmol/L (ref 135–145)

## 2015-08-07 LAB — CK
Total CK: 11034 U/L — ABNORMAL HIGH (ref 49–397)
Total CK: 11926 U/L — ABNORMAL HIGH (ref 49–397)

## 2015-08-07 NOTE — Discharge Instructions (Signed)

## 2015-08-07 NOTE — Progress Notes (Addendum)
Pt wanting to leave, MD Sudini notified, Per MD nurse called lab to check results of CK, (results are not back yet). Pt requesting some of his belongings (keys, shirt and flashlight). No belonging at the bedside. MD Sudini notified that results are not back, Pt advised that he is leaving against medical advise. He verbalized understanding of this. AMS form signed and placed in the chart.

## 2015-08-07 NOTE — Progress Notes (Signed)
Pt relations called  they found a personal belonging bag, pt notified per pt he will pick up bag.

## 2015-08-07 NOTE — Clinical Social Work Note (Signed)
Clinical Social Work Assessment  Patient Details  Name: Kenneth [name] MRN: [id number] Date of Birth: 07-21-1980  Date of referral:  08/07/15               Reason for consult:  Substance Use/ETOH Abuse                Permission sought to share information with:    Permission granted to share information::     Name::        Agency::     Relationship::     Contact Information:     Housing/Transportation Living arrangements for the past 2 months:  Single Family Home Source of Information:  Patient Patient Interpreter Needed:  None Criminal Activity/Legal Involvement Pertinent to Current Situation/Hospitalization:  No - Comment as needed Significant Relationships:  Spouse Lives with:  Minor Children, Spouse Do you feel safe going back to the place where you live?  Yes Need for family participation in patient care:  Yes (Comment)  Care giving concerns:  Patient lives in [place] with his wife and minor children 31 y.o and 35 y.o.     Social Worker assessment / plan:  Holiday representative (CSW) received consult behavioral health concerns. Per psych note patient is cleared and IVC was lifted. CSW met with patient to provide outpatient substance abuse resources. Patient was alert and oriented and laying in the bed. CSW introduced self and explained role of CSW department. Per patient he lives in [place] with his family. Patient reported that him nd his wife decided to use Molly for the first time together this past weekend. Per patient their children were at their grandparents house during the drug use. Patient reported that his wife used molly once then stopped and he continued to use. Patient reported that he is never going to use molly again. Patient denied alcohol and other drug use. CSW asked patient about his positive marijuana drug screen. Per patient he uses marijaune daily and reported that he does not use in the house while his kids are at home or under his care. Per patient his wife does  not use drugs or drink alcohol. CSW provided patient with outpatient substance abuse treatment options like [facility] and [facility]. Patient reported that he is motivated to follow up with treatment and does have a car for transportation. Patient reported no other needs or concerns at this time. Patient asked CSW when he can leave the hospital. CSW made patient aware that MD will make the decision when patient is medically stable for D/C. Please reconsult if future social work needs arise. CSW signing off.   Employment status:  [employer] information:  Self Pay (Medicaid Pending) PT Recommendations:  Not assessed at this time Information / Referral to community resources:  Outpatient Substance Abuse Treatment Options  Patient/Family's Response to care:  Patient was open to substance abuse treatment and appeared motivated to follow up with resources provided to him.   Patient/Family's Understanding of and Emotional Response to Diagnosis, Current Treatment, and Prognosis:  Patient was pleasant throughout assessment and thanked CSW for providing resources.   Emotional Assessment Appearance:  Appears stated age Attitude/Demeanor/Rapport:    Affect (typically observed):  Accepting, Adaptable, Pleasant Orientation:  Oriented to Self, Oriented to Place, Oriented to  Time, Oriented to Situation Alcohol / Substance use:  Illicit Drugs Psych involvement (Current and /or in the community):  Yes (Comment) (Psych has cleared patient. )  Discharge Needs  Concerns to be addressed:  Substance Abuse Concerns  Readmission within the last 30 days:  No Current discharge risk:  Substance Abuse Barriers to Discharge:  Continued Medical Work up   Elwyn Reach 08/07/2015, 10:55 AM

## 2015-08-10 LAB — CULTURE, BLOOD (ROUTINE X 2)
CULTURE: NO GROWTH
CULTURE: NO GROWTH

## 2015-08-12 NOTE — Discharge Summary (Signed)
[facility]   PATIENT NAME: Kenneth GrebeJason [name]    MR#:  [id number]  DATE OF BIRTH:  [birth year]  DATE OF ADMISSION:  08/05/2015 ADMITTING PHYSICIAN: Milagros LollSrikar Laryssa Hassing, MD  DATE OF DISCHARGE: 08/07/2015  7:23 PM  PRIMARY CARE PHYSICIAN: No PCP Per Patient   ADMISSION DIAGNOSIS:  Wrist pain, acute, right [M25.531] Non-traumatic rhabdomyolysis [M62.82]  DISCHARGE DIAGNOSIS:  Principal Problem:   Drug psychosis, with delusions (HCC) Active Problems:   Rhabdomyolysis   Hallucinogen abuse   Cocaine dependence (HCC)   SECONDARY DIAGNOSIS:   Past Medical History  Diagnosis Date  . Seizures (HCC)      ADMITTING HISTORY  Kenneth GrebeJason [name] is a 35 y.o. male who presents with altered mental status. He is accompanied by police and is handcuffed. Reportedly police were called to his neighborhood because he was acting bizarre and knocking on people's doors in the middle of the night. Patient reports he was just high but his fairly paranoid and worried that we are going to "shoot him ". He admits to crack cocaine and police also report that he used "molly" Dark urine in ED. CK checked and >8000. Mild elevation of creatinine.  IVC placed in ED. Psych consulted.  History obtained from old records and ED staff. Patient is drowzy and poor historian  He does complain of pleuritic left lower chest pain   HOSPITAL COURSE:   * Acute rhabdomyolysis, Slow improvement Treated aggressively with IV fluids in the hospital. CK has improved from 27,000 11,000.  * ARF with dehydration. Resolved  * leucocytosis due to stress reaction. Improved.  * Cocaine and MDMA abuse. Counseled for cessation.  * Tobacco abuse. Smoking cessation was counseled during this admission  * Depression.  Seen by psychiatry. Advised no inpatient therapy. Outpatient follow-up.  * Left chest tenderness, improved.  Check Xray: no rib fracture. Pain control.   * Hypokalemia.  Replaced and resolved  Plan was to repeat CK and discharge patient home if the CK level was improving. But patient refused to wait for repeat labs or IV fluids. Patient counseled but he has signed out AMA.  Patient was seen and examined earlier in the day on day of discharge.   CONSULTS OBTAINED:  Treatment Team:  Audery AmelJohn T Clapacs, MD  DRUG ALLERGIES:   Allergies  Allergen Reactions  . Ibuprofen Other (See Comments)    Reaction:  Unknown   . Tylenol [Acetaminophen] Other (See Comments)    Reaction:  Unknown     DISCHARGE MEDICATIONS:   Discharge Medication List as of 08/07/2015  7:23 PM    CONTINUE these medications which have NOT CHANGED   Details  omeprazole (PRILOSEC) 20 MG capsule Take 20 mg by mouth daily., Until Discontinued, Historical Med      STOP taking these medications     diphenhydrAMINE (BENADRYL CHILDRENS ALLERGY) 12.5 MG/5ML liquid      ketorolac (TORADOL) 10 MG tablet      lidocaine (XYLOCAINE) 2 % solution         Today   VITAL SIGNS:  Blood pressure 126/89, pulse 52, temperature 98.1 F (36.7 C), temperature source Oral, resp. rate 18, height 6' (1.829 m), weight 109.408 kg (241 lb 3.2 oz), SpO2 99 %.  I/O:  No intake or output data in the 24 hours ending 08/12/15 1605  PHYSICAL EXAMINATION:  Physical Exam  GENERAL:  35 y.o.-year-old patient lying in the bed with no acute distress.  LUNGS: Normal breath sounds bilaterally,  no wheezing, rales,rhonchi or crepitation. No use of accessory muscles of respiration.  CARDIOVASCULAR: S1, S2 normal. No murmurs, rubs, or gallops.  ABDOMEN: Soft, non-tender, non-distended. Bowel sounds present. No organomegaly or mass.  NEUROLOGIC: Moves all 4 extremities. PSYCHIATRIC: The patient is alert and oriented x 3.  SKIN: No obvious rash, lesion, or ulcer.   DATA REVIEW:   CBC  Recent Labs Lab 08/06/15 0426  WBC 9.7  HGB 13.8  HCT 39.3*  PLT 147*    Chemistries   Recent Labs Lab 08/06/15 0426  08/07/15 0527  NA 136 137  K 3.4* 3.9  CL 110 112*  CO2 21* 22  GLUCOSE 103* 112*  BUN 26* 17  CREATININE 1.00 0.79  CALCIUM 8.2* 8.1*  MG 2.3  --   AST 222*  --   ALT 78*  --   ALKPHOS 26*  --   BILITOT 1.7*  --     Cardiac Enzymes No results for input(s): TROPONINI in the last 168 hours.  Microbiology Results  Results for orders placed or performed during the hospital encounter of 08/05/15  CULTURE, BLOOD (ROUTINE X 2) w Reflex to PCR ID Panel     Status: None   Collection Time: 08/05/15  2:44 PM  Result Value Ref Range Status   Specimen Description BLOOD RIGHT ASSIST CONTROL  Final   Special Requests BOTTLES DRAWN AEROBIC AND ANAEROBIC 9CC  Final   Culture NO GROWTH 5 DAYS  Final   Report Status 08/10/2015 FINAL  Final  CULTURE, BLOOD (ROUTINE X 2) w Reflex to PCR ID Panel     Status: None   Collection Time: 08/05/15  2:45 PM  Result Value Ref Range Status   Specimen Description BLOOD RIGHT HAND  Final   Special Requests BOTTLES DRAWN AEROBIC AND ANAEROBIC 6CC  Final   Culture NO GROWTH 5 DAYS  Final   Report Status 08/10/2015 FINAL  Final    RADIOLOGY:  No results found.  Follow up with PCP in 1 week.  Management plans discussed with the patient, family and they are in agreement.  CODE STATUS:  Code Status History    Date Active Date Inactive Code Status Order ID Comments User Context   08/05/2015  2:09 PM 08/07/2015 10:23 PM Full Code [id number]  Milagros Loll, MD ED      TOTAL TIME TAKING CARE OF THIS PATIENT ON DAY OF DISCHARGE: more than 30 minutes.   Milagros Loll R M.D on 08/12/2015 at 4:05 PM  Between 7am to 6pm - Pager - [phone]  After 6pm go to [email or web address] - password [email or web address] [facility]  [facility]  Office  [phone]  CC: Primary care physician; No PCP Per Patient  Note: This dictation was prepared with Dragon dictation along with smaller phrase technology. Any transcriptional errors that result from this process are  unintentional.

## 2015-09-25 ENCOUNTER — Encounter: Payer: Self-pay | Admitting: Emergency Medicine

## 2015-09-25 ENCOUNTER — Emergency Department
Admission: EM | Admit: 2015-09-25 | Discharge: 2015-09-26 | Disposition: A | Discharge status: Home / Self Care | Payer: Self-pay | Attending: Emergency Medicine | Admitting: Emergency Medicine

## 2015-09-25 DIAGNOSIS — F1429 Cocaine dependence with unspecified cocaine-induced disorder: Secondary | ICD-10-CM

## 2015-09-25 DIAGNOSIS — F22 Delusional disorders: Secondary | ICD-10-CM | POA: Insufficient documentation

## 2015-09-25 DIAGNOSIS — Z8669 Personal history of other diseases of the nervous system and sense organs: Secondary | ICD-10-CM | POA: Insufficient documentation

## 2015-09-25 DIAGNOSIS — R0789 Other chest pain: Secondary | ICD-10-CM | POA: Insufficient documentation

## 2015-09-25 DIAGNOSIS — F14282 Cocaine dependence with cocaine-induced sleep disorder: Secondary | ICD-10-CM | POA: Insufficient documentation

## 2015-09-25 DIAGNOSIS — F17210 Nicotine dependence, cigarettes, uncomplicated: Secondary | ICD-10-CM | POA: Insufficient documentation

## 2015-09-25 DIAGNOSIS — F19950 Other psychoactive substance use, unspecified with psychoactive substance-induced psychotic disorder with delusions: Secondary | ICD-10-CM

## 2015-09-25 LAB — BASIC METABOLIC PANEL
ANION GAP: 7 (ref 5–15)
BUN: 15 mg/dL (ref 6–20)
CALCIUM: 9.5 mg/dL (ref 8.9–10.3)
CO2: 25 mmol/L (ref 22–32)
CREATININE: 1.31 mg/dL — AB (ref 0.61–1.24)
Chloride: 108 mmol/L (ref 101–111)
Glucose, Bld: 89 mg/dL (ref 65–99)
Potassium: 3.7 mmol/L (ref 3.5–5.1)
SODIUM: 140 mmol/L (ref 135–145)

## 2015-09-25 LAB — CBC
HCT: 42.1 % (ref 40.0–52.0)
HEMOGLOBIN: 14.8 g/dL (ref 13.0–18.0)
MCH: 31.5 pg (ref 26.0–34.0)
MCHC: 35.1 g/dL (ref 32.0–36.0)
MCV: 89.8 fL (ref 80.0–100.0)
PLATELETS: 166 10*3/uL (ref 150–440)
RBC: 4.69 MIL/uL (ref 4.40–5.90)
RDW: 12.9 % (ref 11.5–14.5)
WBC: 12.8 10*3/uL — AB (ref 3.8–10.6)

## 2015-09-25 LAB — TROPONIN I

## 2015-09-25 LAB — ACETAMINOPHEN LEVEL

## 2015-09-25 LAB — ETHANOL

## 2015-09-25 LAB — SALICYLATE LEVEL: Salicylate Lvl: <4 mg/dL (ref 2.8–30.0)

## 2015-09-25 NOTE — ED Notes (Signed)
Pt's belongings include (1) cell phone, (1) lighter, and (1) 1" spade-type drill-bit.  Belongings placed in plastic bag with pt's name label.

## 2015-09-25 NOTE — ED Notes (Signed)
[facility] with c/o chest pain after smoking "approx $1000 worth of crack for the past week" per medic. Pt was found outside his home wandering in the street exhibiting bizarre behavior until EMS was called. Pt states he is concerned someone ""messed with his drugs" because he feels "different this time" due to feeling paranoid and hallucinating. Pt currently appears anxious.

## 2015-09-25 NOTE — Discharge Instructions (Signed)
You have been seen in the Emergency Department (ED) today for substance abuse.  Since you feel better at this time and have a reassuring medical workup, we think it is appropriate to discharge you, please avoid additional drug use as this is what was causing your issues earlier today.  Please return to the ED immediately if you have ANY thoughts of hurting yourself or anyone else, so that we may help you.  Please avoid alcohol and drug use.  Follow up with your doctor and/or therapist as soon as possible regarding today's ED  visit.   Please follow up any other recommendations and clinic appointments provided by the psychiatry team that saw you in the Emergency Department.   Stimulant Use Disorder-Cocaine Cocaine is one of a group of powerful drugs called stimulants. Cocaine has medical uses for stopping nosebleeds and for pain control before minor nose or dental surgery. However, cocaine is misused because of the effects that it produces. These effects include:   A feeling of extreme pleasure.  Alertness.  High energy. Common street names for cocaine include coke, crack, blow, snow, and nose candy. Cocaine is snorted, dissolved in water and injected, or smoked.  Stimulants are addictive because they activate regions of the brain that produce both the pleasurable sensation of "reward" and psychological dependence. Together, these actions account for loss of control and the rapid development of drug dependence. This means you become ill without the drug (withdrawal) and need to keep using it to function.  Stimulant use disorder is use of stimulants that disrupts your daily life. It disrupts relationships with family and friends and how you do your job. Cocaine increases your blood pressure and heart rate. It can cause a heart attack or stroke. Cocaine can also cause death from irregular heart rate or seizures. SYMPTOMS Symptoms of stimulant use disorder with cocaine include:  Use of cocaine in  larger amounts or over a longer period of time than intended.  Unsuccessful attempts to cut down or control cocaine use.  A lot of time spent obtaining, using, or recovering from the effects of cocaine.  A strong desire or urge to use cocaine (craving).  Continued use of cocaine in spite of major problems at work, school, or home because of use.  Continued use of cocaine in spite of relationship problems because of use.  Giving up or cutting down on important life activities because of cocaine use.  Use of cocaine over and over in situations when it is physically hazardous, such as driving a car.  Continued use of cocaine in spite of a physical problem that is likely related to use. Physical problems can include:  Malnutrition.  Nosebleeds.  Chest pain.  High blood pressure.  A hole that develops between the part of your nose that separates your nostrils (perforated nasal septum).  Lung and kidney damage.  Continued use of cocaine in spite of a mental problem that is likely related to use. Mental problems can include:  Schizophrenia-like symptoms.  Depression.  Bipolar mood swings.  Anxiety.  Sleep problems.  Need to use more and more cocaine to get the same effect, or lessened effect over time with use of the same amount of cocaine (tolerance).  Having withdrawal symptoms when cocaine use is stopped, or using cocaine to reduce or avoid withdrawal symptoms. Withdrawal symptoms include:  Depressed or irritable mood.  Low energy or restlessness.  Bad dreams.  Poor or excessive sleep.  Increased appetite. DIAGNOSIS Stimulant use disorder is diagnosed  by your health care provider. You may be asked questions about your cocaine use and how it affects your life. A physical exam may be done. A drug screen may be ordered. You may be referred to a mental health professional. The diagnosis of stimulant use disorder requires at least two symptoms within 12 months. The type  of stimulant use disorder depends on the number of signs and symptoms you have. The type may be:  Mild. Two or three signs and symptoms.  Moderate. Four or five signs and symptoms.  Severe. Six or more signs and symptoms. TREATMENT Treatment for stimulant use disorder is usually provided by mental health professionals with training in substance use disorders. The following options are available:  Counseling or talk therapy. Talk therapy addresses the reasons you use cocaine and ways to keep you from using again. Goals of talk therapy include:  Identifying and avoiding triggers for use.  Handling cravings.  Replacing use with healthy activities.  Support groups. Support groups provide emotional support, advice, and guidance.  Medicine. Certain medicines may decrease cocaine cravings or withdrawal symptoms. HOME CARE INSTRUCTIONS  Take medicines only as directed by your health care provider.  Identify the people and activities that trigger your cocaine use and avoid them.  Keep all follow-up visits as directed by your health care provider. SEEK MEDICAL CARE IF:  Your symptoms get worse or you relapse.  You are not able to take medicines as directed. SEEK IMMEDIATE MEDICAL CARE IF:  You have serious thoughts about hurting yourself or others.  You have a seizure, chest pain, sudden weakness, or loss of speech or vision. FOR MORE INFORMATION  National Institute on Drug Abuse: http://www.price-smith.com/  Substance Abuse and Mental Health Services Administration: SkateOasis.com.pt   This information is not intended to replace advice given to you by your health care provider. Make sure you discuss any questions you have with your health care provider.   Document Released: 05/08/2000 Document Revised: 06/01/2014 Document Reviewed: 05/24/2013 Elsevier Interactive Patient Education 2016 [employer].  Finding Treatment for Addiction WHAT IS ADDICTION? Addiction is a complex disease of  the brain. It causes an uncontrollable (compulsive) need for a substance. You can be addicted to alcohol, illegal drugs, or prescription medicines such as painkillers. Addiction can also be a behavior, like gambling or shopping. The need for the drug or activity can become so strong that you think about it all the time. You can also become physically dependent on a substance. Addiction can change the way your brain works. Because of these changes, getting more of whatever you are addicted to becomes the most important thing to you and feels better than other activities or relationships. Addiction can lead to changes in health, behavior, emotions, relationships, and choices that affect you and everyone around you. HOW DO I KNOW IF I NEED TREATMENT FOR ADDICTION? Addiction is a progressive disease. Without treatment, addiction can get worse. Living with addiction puts you at higher risk for injury, poor health, lost employment, loss of money, and even death. You might need treatment for addiction if:  You have tried to stop or cut down, but you cannot.  Your addiction is causing physical health problems.  You find it annoying that your friends and family are concerned about your alcohol or substance use.  You feel guilty about substance abuse or a compulsive behavior.  You have lied or tried to hide your addiction.  You need a particular substance or activity to start your day or  to calm down.  You are getting in trouble at school, work, home, or with the police.  You have done something illegal to support your addiction.  You are running out of money because of your addiction.  You have no time for anything other than your addiction. WHAT TYPES OF TREATMENT ARE AVAILABLE? The treatment program that is right for you will depend on many factors, including the type of addiction you have. Treatment programs can be outpatient or inpatient. In an outpatient program, you live at home and go to work  or school, but you also go to a clinic for treatment. With an inpatient program, you live and sleep at the program facility during treatment. After treatment, you might need a plan for support during recovery. Other treatment options include:   Medicine.  Some addictions may be treated with prescription medicines.  You might also need medicine to treat anxiety or depression.  Counseling and behavior therapy. Therapy can help individuals and families behave in healthier ways and relate more effectively.  Support groups. Confidential group therapy, such as a 12-step program, can help individuals and families during treatment and recovery. No single type of program is right for everyone. Many treatment programs involve a combination of education, counseling, and a 12-step, spiritually-based approach. Some treatment programs are government sponsored. They are geared for patients who do not have private insurance. Treatment programs can vary in many respects, such as:  Cost and types of insurance that are accepted.  Types of on-site medical services that are offered.  Length of stay, setting, and size.  Overall philosophy of treatment. WHAT SHOULD I CONSIDER WHEN SELECTING A TREATMENT PROGRAM? It is important to think about your individual requirements when selecting a treatment program. There are a number of things to consider, such as:  If the program is certified by the appropriate government agency. Even private programs must be certified and employ certified professionals.  If the program is covered by your insurance. If finances are a concern, the first call you should make is to your insurance company, if you have health insurance. Ask for a list of treatment programs that are in your network, and confirm any copayments and deductibles that you may have to pay.  If you do not have insurance, or if you choose to attend a program that does not accept your insurance, discuss whether a  payment plan can be set up.  If treatment is available in languages other than English, if needed.  If the program offers detoxification treatment, if needed.  If 12-step meetings are held at the center or if transport is available for patients to attend meetings at other locations.  If the program is professional, organized, and clean.  If the program meets all of your needs, including physical and cultural needs.  If the facility offers specific treatment for your particular addiction.  If support continues to be offered after you have left the program.  If your treatment plan is continually looked at to make sure you are receiving the right treatment at the right time.  If mental health counseling is part of your treatment.  If medicine is included in treatment, if needed.  If your family is included in your treatment plan and if support is offered to them throughout the treatment process.  How the treatment works to prevent relapse. WHERE ELSE CAN I GET HELP?  Your health care provider. Ask him or her to help you find addiction treatment. These discussions are confidential.  The [employer] on Alcoholism and Drug Dependence (NCADD). This group has information about treatment centers and programs for people who have an addiction and for family members.  The telephone number is 1-800-NCA-CALL (859-535-8674).  The website is https://ncadd.org/about-ncadd/our-affiliates  The Substance Abuse and Mental Health Services Administration [facility]). This group will help you find publicly funded treatment centers, help hotlines, and counseling services near you.  The telephone number is 1-800-662-HELP (414 451 6584).  The website is www.findtreatment.RockToxic.pl In countries outside of the [place]. and [place], look in [employer] for contact information for services in your area.   This information is not intended to replace advice given to you by your health care provider.  Make sure you discuss any questions you have with your health care provider.   Document Released: 04/09/2005 Document Revised: 01/30/2015 Document Reviewed: 02/27/2014 Elsevier Interactive Patient Education 2016 Elsevier Inc.  Chemical Dependency Chemical dependency is an addiction to drugs or alcohol. It is characterized by the repeated behavior of seeking out and using drugs and alcohol despite harmful consequences to the health and safety of ones self and others.  RISK FACTORS There are certain situations or behaviors that increase a person's risk for chemical dependency. These include:  A family history of chemical dependency.  A history of mental health issues, including depression and anxiety.  A home environment where drugs and alcohol are easily available to you.  Drug or alcohol use at a young age. SYMPTOMS  The following symptoms can indicate chemical dependency:  Inability to limit the use of drugs or alcohol.  Nausea, sweating, shakiness, and anxiety that occurs when alcohol or drugs are not being used.  An increase in amount of drugs or alcohol that is necessary to get drunk or high. People who experience these symptoms can assess their use of drugs and alcohol by asking themselves the following questions:  Have you been told by friends or family that they are worried about your use of alcohol or drugs?  Do friends and family ever tell you about things you did while drinking alcohol or using drugs that you do not remember?  Do you lie about using alcohol or drugs or about the amounts you use?  Do you have difficulty completing daily tasks unless you use alcohol or drugs?  Is the level of your work or school performance lower because of your drug or alcohol use?  Do you get sick from using drugs or alcohol but keep using anyway?  Do you feel uncomfortable in social situations unless you use alcohol or drugs?  Do you use drugs or alcohol to help forget  problems? An answer of yes to any of these questions may indicate chemical dependency. Professional evaluation is suggested.   This information is not intended to replace advice given to you by your health care provider. Make sure you discuss any questions you have with your health care provider.   Document Released: 05/05/2001 Document Revised: 08/03/2011 Document Reviewed: 07/17/2010 Elsevier Interactive Patient Education [employer]

## 2015-09-25 NOTE — ED Provider Notes (Signed)
[facility] Emergency Department Provider Note  ____________________________________________  Time seen: Approximately 10:14 PM  I have reviewed the triage vital signs and the nursing notes.   HISTORY  Chief Complaint Chest Pain; Drug Problem; and Paranoid    HPI Kenneth [name] is a 35 y.o. male the patient was brought in by [facility] with chest pain after smoking "a lot" of crack.  He regularly uses cocaine and said that sometimes he gets very paranoid after smoking it.  Reportedly he was wandering outside his home in the street and acting bizarre when EMS was called.  Initially the patient was paranoid and saying that he thought someone messed with this drugs because he feels different, but currently he is sleeping and states he just feels tired.  He said that he does get paranoid sometimes when he smokes.  He denies suicidal ideation and homicidal ideation and admits that he does have a cocaine problem but he has no intention of getting clean.  LA denies headache, shortness of breath, chest pain, nausea, vomiting, diarrhea.  His symptoms are severe earlier but they have essentially resolved.   Past Medical History  Diagnosis Date  . Seizures [facility](HCC)     Patient Active Problem List   Diagnosis Date Noted  . 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 Cocaine dependence (HCC) 08/06/2015  . Rhabdomyolysis 08/05/2015    Past Surgical History  Procedure Laterality Date  . Tonsillectomy      No current outpatient prescriptions on file.  Allergies Ibuprofen and Tylenol  No family history on file.  Social History Social History  Substance Use Topics  . Smoking status: Current Every Day Smoker -- 1.00 packs/day    Types: Cigarettes  . Smokeless tobacco: None  . Alcohol Use: No    Review of Systems Constitutional: No fever/chills Eyes: No visual changes. ENT: No sore throat. Cardiovascular:  Denies chest pain currently, had CP after smoking crack Respiratory: Denies shortness of breath. Gastrointestinal: No abdominal pain.  No nausea, no vomiting.  No diarrhea.  No constipation. Genitourinary: Negative for dysuria. Musculoskeletal: Negative for back pain. Skin: Negative for rash. Neurological: Negative for headaches, focal weakness or numbness.  10-point ROS otherwise negative.  ____________________________________________   PHYSICAL EXAM:  VITAL SIGNS: ED Triage Vitals  Enc Vitals Group     BP 09/25/15 2133 121/76 mmHg     Pulse Rate 09/25/15 2133 90     Resp 09/25/15 2133 20     Temp 09/25/15 2133 98.6 F (37 C)     Temp Source 09/25/15 2133 Oral     SpO2 09/25/15 2133 95 %     Weight 09/25/15 2133 240 lb (108.863 kg)     Height 09/25/15 2133 6\' 3"  (1.905 m)     Head Cir --      Peak Flow --      Pain Score 09/25/15 2134 8     Pain Loc --      Pain Edu? --      Excl. in GC? --     Constitutional: Alert and oriented. Well appearing and in no acute distress. Eyes: Conjunctivae are normal. PERRL. EOMI. Head: Atraumatic. Nose: No congestion/rhinnorhea. Mouth/Throat: Mucous membranes are moist.  Oropharynx non-erythematous. Neck: No stridor.  No meningeal signs.   Cardiovascular: Normal rate, regular rhythm. Good peripheral circulation. Grossly normal heart sounds.   Respiratory: Normal respiratory effort.  No retractions. Lungs CTAB. Gastrointestinal: Soft and nontender. No  distention.  Musculoskeletal: No lower extremity tenderness nor edema. No gross deformities of extremities. Neurologic:  Normal speech and language. No gross focal neurologic deficits are appreciated.  Skin:  Skin is warm, dry and intact. No rash noted. Psychiatric: Mood and affect are normal. Speech and behavior are normal. Denies SI/HI.  No current hallucinations  ____________________________________________   LABS (all labs ordered are listed, but only abnormal results are  displayed)  Labs Reviewed  BASIC METABOLIC PANEL - Abnormal; Notable for the following:    Creatinine, Ser 1.31 (*)    All other components within normal limits  CBC - Abnormal; Notable for the following:    WBC 12.8 (*)    All other components within normal limits  ACETAMINOPHEN LEVEL - Abnormal; Notable for the following:    Acetaminophen (Tylenol), Serum <10 (*)    All other components within normal limits  TROPONIN I  SALICYLATE LEVEL  ETHANOL  URINE DRUG SCREEN, QUALITATIVE ([facility] ONLY)   ____________________________________________  EKG  ED ECG REPORT I, Ebon Ketchum, the attending physician, personally viewed and interpreted this ECG.  Date: 09/25/2015 EKG Time: 21:36 Rate: 87 Rhythm: normal sinus rhythm QRS Axis: normal Intervals: Incomplete right bundle branch block ST/T Wave abnormalities: normal Conduction Disturbances: none Narrative Interpretation: unremarkable  ____________________________________________  RADIOLOGY   No results found.  ____________________________________________   PROCEDURES  Procedure(s) performed: None  Critical Care performed: No ____________________________________________   INITIAL IMPRESSION / ASSESSMENT AND PLAN / ED COURSE  Pertinent labs & imaging results that were available during my care of the patient were reviewed by me and considered in my medical decision making (see chart for details).  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  The patient does not present a danger to himself or others at this time.  He is oriented and appropriate.  I am awaiting blood work but anticipate discharge given that he does not want help with his addiction and he likely has no acute medical issues at this time.   ____________________________________________  FINAL CLINICAL IMPRESSION(S) / ED DIAGNOSES  Final diagnoses:  Drug psychosis, with delusions (HCC)  Cocaine dependence with  cocaine-induced disorder (HCC)  Atypical chest pain     MEDICATIONS GIVEN DURING THIS VISIT:  Medications - No data to display   NEW OUTPATIENT MEDICATIONS STARTED DURING THIS VISIT:  New Prescriptions   No medications on file      Note:  This document was prepared using Dragon voice recognition software and may include unintentional dictation errors.   Loleta Rose, MD 09/25/15 [phone]

## 2015-09-25 NOTE — Progress Notes (Signed)
This writer attempted to assessment but unable to wake.  It was reported the patient ingested a large amount of crack cocaine prior to admission therefore allowing him to sober up before assessing is recommended.  Maryelizabeth Rowanressa Aidee Latimore, MSW, Clare CharonLCSW, LCAS [facility] Triage Specialist [phone]
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
## 2017-12-03 IMAGING — CR DG WRIST 2V*R*
2 series · 2 of 2 positions shown · non-contrast
Comparison: None.

CLINICAL DATA: Wrist pain

EXAM:
RIGHT WRIST - 2 VIEW

[wrist pa]
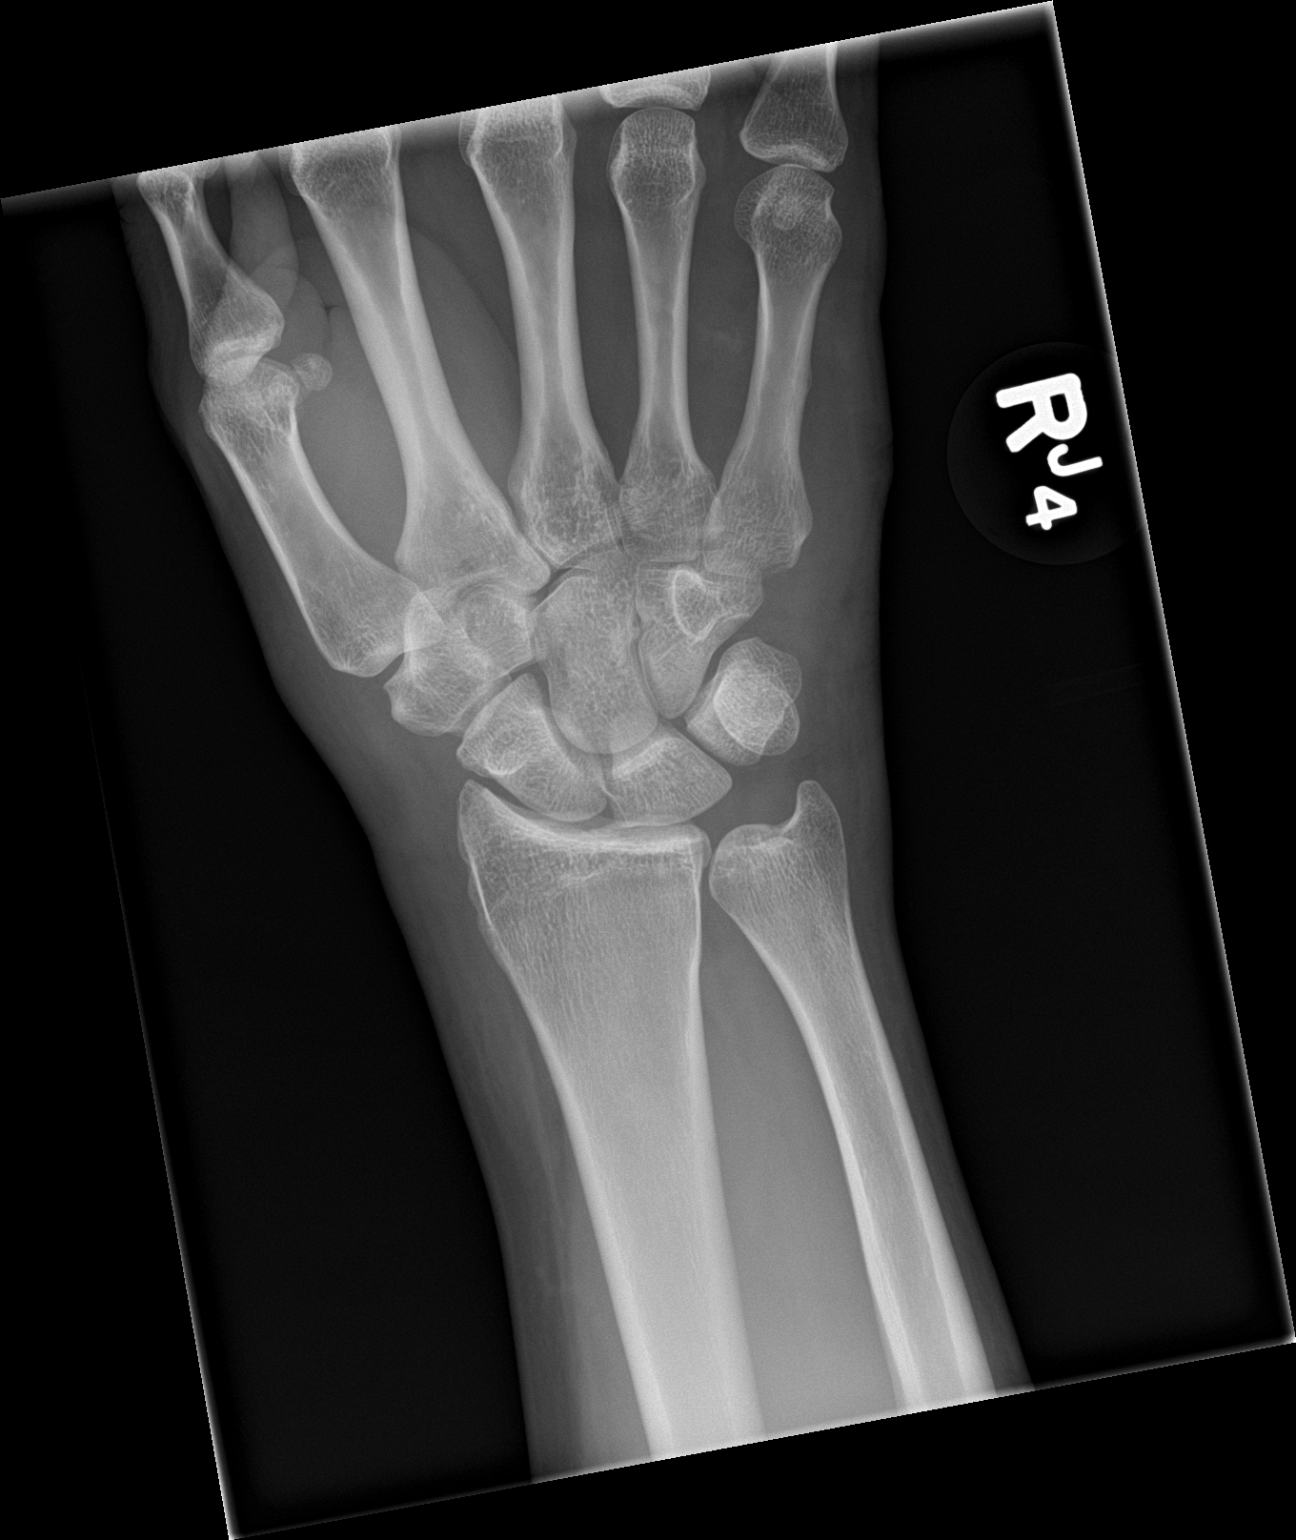

[wrist lat]
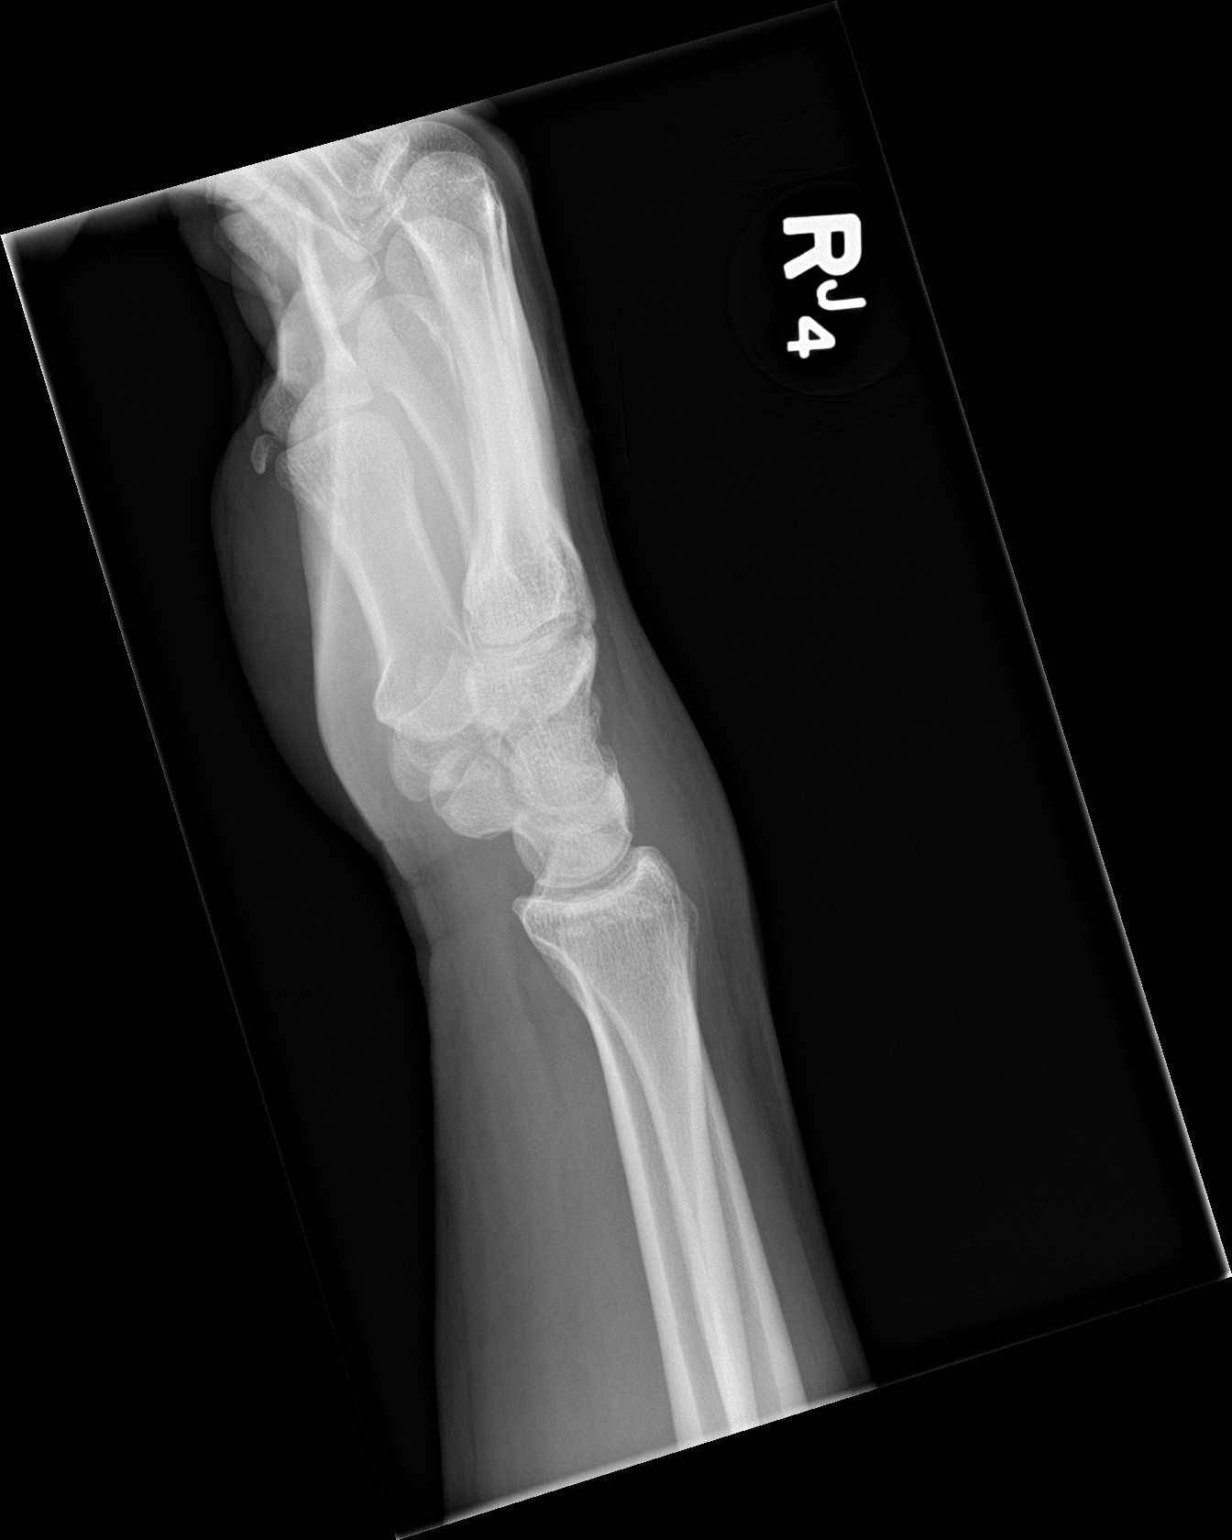

[2 of 2 positions shown; findings below may reference images not displayed]

FINDINGS: No acute fracture. No dislocation.  Unremarkable soft tissues.
IMPRESSION: No acute bony pathology.
